# Patient Record
Sex: Female | Born: 1970 | Race: White | Hispanic: Yes | Marital: Married | Smoking: Never smoker
Health system: Southern US, Community
[De-identification: ages and names within clinical notes are randomized; demographics above are authoritative.]

## PROBLEM LIST (undated history)

## (undated) HISTORY — PX: CHOLECYSTECTOMY: SHX55

## (undated) HISTORY — PX: OTHER SURGICAL HISTORY: SHX169

---

## 2001-03-05 HISTORY — PX: COLON SURGERY: SHX602

## 2002-03-15 ENCOUNTER — Inpatient Hospital Stay (HOSPITAL_COMMUNITY): Admission: EM | Admit: 2002-03-15 | Discharge: 2002-03-17 | Payer: Self-pay | Admitting: Emergency Medicine

## 2002-03-15 ENCOUNTER — Encounter: Payer: Self-pay | Admitting: Emergency Medicine

## 2002-03-18 ENCOUNTER — Encounter: Admission: RE | Admit: 2002-03-18 | Discharge: 2002-03-18 | Payer: Self-pay | Admitting: Family Medicine

## 2002-03-23 ENCOUNTER — Encounter: Admission: RE | Admit: 2002-03-23 | Discharge: 2002-03-23 | Payer: Self-pay | Admitting: Family Medicine

## 2002-04-01 ENCOUNTER — Inpatient Hospital Stay (HOSPITAL_COMMUNITY): Admission: EM | Admit: 2002-04-01 | Discharge: 2002-04-06 | Payer: Self-pay | Admitting: Emergency Medicine

## 2002-04-01 ENCOUNTER — Encounter: Payer: Self-pay | Admitting: *Deleted

## 2002-04-15 ENCOUNTER — Encounter: Admission: RE | Admit: 2002-04-15 | Discharge: 2002-04-15 | Payer: Self-pay | Admitting: Family Medicine

## 2002-04-16 ENCOUNTER — Encounter: Admission: RE | Admit: 2002-04-16 | Discharge: 2002-04-16 | Payer: Self-pay | Admitting: Family Medicine

## 2002-05-07 ENCOUNTER — Encounter (INDEPENDENT_AMBULATORY_CARE_PROVIDER_SITE_OTHER): Payer: Self-pay | Admitting: *Deleted

## 2002-05-07 LAB — CONVERTED CEMR LAB

## 2002-05-14 ENCOUNTER — Encounter: Admission: RE | Admit: 2002-05-14 | Discharge: 2002-05-14 | Payer: Self-pay | Admitting: Family Medicine

## 2002-05-15 ENCOUNTER — Encounter: Admission: RE | Admit: 2002-05-15 | Discharge: 2002-05-15 | Payer: Self-pay | Admitting: Family Medicine

## 2002-05-28 ENCOUNTER — Encounter (INDEPENDENT_AMBULATORY_CARE_PROVIDER_SITE_OTHER): Payer: Self-pay | Admitting: *Deleted

## 2002-05-28 ENCOUNTER — Inpatient Hospital Stay (HOSPITAL_COMMUNITY): Admission: RE | Admit: 2002-05-28 | Discharge: 2002-06-06 | Payer: Self-pay | Admitting: General Surgery

## 2002-07-29 ENCOUNTER — Encounter: Admission: RE | Admit: 2002-07-29 | Discharge: 2002-07-29 | Payer: Self-pay | Admitting: Family Medicine

## 2002-12-10 ENCOUNTER — Encounter: Admission: RE | Admit: 2002-12-10 | Discharge: 2002-12-10 | Payer: Self-pay | Admitting: Family Medicine

## 2003-02-09 ENCOUNTER — Other Ambulatory Visit: Admission: RE | Admit: 2003-02-09 | Discharge: 2003-02-09 | Payer: Self-pay | Admitting: Obstetrics and Gynecology

## 2003-05-11 ENCOUNTER — Emergency Department (HOSPITAL_COMMUNITY): Admission: EM | Admit: 2003-05-11 | Discharge: 2003-05-11 | Payer: Self-pay | Admitting: Emergency Medicine

## 2003-06-10 ENCOUNTER — Encounter: Admission: RE | Admit: 2003-06-10 | Discharge: 2003-06-10 | Payer: Self-pay | Admitting: Family Medicine

## 2003-06-17 ENCOUNTER — Encounter: Admission: RE | Admit: 2003-06-17 | Discharge: 2003-06-17 | Payer: Self-pay | Admitting: *Deleted

## 2003-06-17 ENCOUNTER — Ambulatory Visit (HOSPITAL_COMMUNITY): Admission: RE | Admit: 2003-06-17 | Discharge: 2003-06-17 | Payer: Self-pay | Admitting: *Deleted

## 2003-06-23 ENCOUNTER — Encounter: Admission: RE | Admit: 2003-06-23 | Discharge: 2003-06-23 | Payer: Self-pay | Admitting: *Deleted

## 2003-06-24 ENCOUNTER — Encounter: Admission: RE | Admit: 2003-06-24 | Discharge: 2003-06-24 | Payer: Self-pay | Admitting: *Deleted

## 2003-07-01 ENCOUNTER — Encounter: Admission: RE | Admit: 2003-07-01 | Discharge: 2003-07-01 | Payer: Self-pay | Admitting: *Deleted

## 2003-07-08 ENCOUNTER — Encounter: Admission: RE | Admit: 2003-07-08 | Discharge: 2003-07-08 | Payer: Self-pay | Admitting: *Deleted

## 2003-07-13 ENCOUNTER — Inpatient Hospital Stay (HOSPITAL_COMMUNITY): Admission: AD | Admit: 2003-07-13 | Discharge: 2003-07-16 | Payer: Self-pay | Admitting: Obstetrics

## 2004-04-20 ENCOUNTER — Other Ambulatory Visit: Admission: RE | Admit: 2004-04-20 | Discharge: 2004-04-20 | Payer: Self-pay | Admitting: Obstetrics and Gynecology

## 2004-07-07 ENCOUNTER — Inpatient Hospital Stay (HOSPITAL_COMMUNITY): Admission: AD | Admit: 2004-07-07 | Discharge: 2004-07-10 | Payer: Self-pay | Admitting: *Deleted

## 2004-07-08 ENCOUNTER — Ambulatory Visit: Payer: Self-pay | Admitting: Obstetrics and Gynecology

## 2006-05-02 DIAGNOSIS — F329 Major depressive disorder, single episode, unspecified: Secondary | ICD-10-CM

## 2006-05-02 DIAGNOSIS — K5732 Diverticulitis of large intestine without perforation or abscess without bleeding: Secondary | ICD-10-CM

## 2006-05-03 ENCOUNTER — Encounter (INDEPENDENT_AMBULATORY_CARE_PROVIDER_SITE_OTHER): Payer: Self-pay | Admitting: *Deleted

## 2007-05-12 ENCOUNTER — Inpatient Hospital Stay (HOSPITAL_COMMUNITY): Admission: AD | Admit: 2007-05-12 | Discharge: 2007-05-14 | Payer: Self-pay | Admitting: Obstetrics & Gynecology

## 2008-12-24 ENCOUNTER — Emergency Department (HOSPITAL_COMMUNITY): Admission: EM | Admit: 2008-12-24 | Discharge: 2008-12-24 | Payer: Self-pay | Admitting: Emergency Medicine

## 2010-06-08 LAB — CBC
HCT: 38.8 % (ref 36.0–46.0)
MCV: 85.1 fL (ref 78.0–100.0)
Platelets: 202 10*3/uL (ref 150–400)
WBC: 7.4 10*3/uL (ref 4.0–10.5)

## 2010-06-08 LAB — COMPREHENSIVE METABOLIC PANEL
Albumin: 3.9 g/dL (ref 3.5–5.2)
BUN: 13 mg/dL (ref 6–23)
CO2: 25 mEq/L (ref 19–32)
Chloride: 106 mEq/L (ref 96–112)
Creatinine, Ser: 1 mg/dL (ref 0.4–1.2)
GFR calc non Af Amer: >60 mL/min (ref >60)
Total Bilirubin: 0.4 mg/dL (ref 0.3–1.2)

## 2010-06-08 LAB — DIFFERENTIAL
Basophils Absolute: 0 10*3/uL (ref 0.0–0.1)
Lymphocytes Relative: 32 % (ref 12–46)
Neutro Abs: 4.3 10*3/uL (ref 1.7–7.7)
Neutrophils Relative %: 58 % (ref 43–77)

## 2010-06-08 LAB — URINE CULTURE

## 2010-06-08 LAB — PROTIME-INR
INR: 0.92 (ref 0.00–1.49)
Prothrombin Time: 12.3 seconds (ref 11.6–15.2)

## 2010-06-08 LAB — URINALYSIS, ROUTINE W REFLEX MICROSCOPIC
Protein, ur: NEGATIVE mg/dL
Urobilinogen, UA: 0.2 mg/dL (ref 0.0–1.0)

## 2010-06-08 LAB — RAPID STREP SCREEN (MED CTR MEBANE ONLY): Streptococcus, Group A Screen (Direct): NEGATIVE

## 2010-07-21 NOTE — Discharge Summary (Signed)
NAME:  Anita Wheeler, Anita Wheeler                           ACCOUNT NO.:  [id number]   MEDICAL RECORD NO.:  [id number]                   PATIENT TYPE:  INP   LOCATION:  5735                                 FACILITY:  [facility]   PHYSICIAN:  Leighton Roach McDiarmid, M.D.             DATE OF BIRTH:  [birth year]   DATE OF ADMISSION:  03/14/2002  DATE OF DISCHARGE:  03/17/2002                                 DISCHARGE SUMMARY   DISCHARGE DIAGNOSES:  1. Cecal diverticulitis.  2. Bacterial vaginosis.   DISCHARGE MEDICATIONS:  1. Ciprofloxacin 500 mg 1 tablet q.8h. x 7 days.  2. Metronidazole 500 mg 1 tablet p.o. q.6h x 7 days.   DISPOSITION:  The patient was discharged to home in stable condition.   DIET:  The patient is to continue a very gentle, bland diet with nothing  spicy or greasy to eat, and ensure good fluid intake.   FOLLOW UP:  Dr. [name] at family practice clinic on 03/18/2002 at 2:15 p.m.  Plan to have barium enema study in two to three weeks after resolution of  symptoms to evaluate for diverticulosis.   CONSULTATIONS:  1. Dr. [name], general surgery.  2. Dr. Russella Dar, [facility].   HISTORY AND PHYSICAL:  The patient is a 40 year old female who presented  with one-day history of severe right-sided abdominal pain that was acute in  onset, sharp in nature, with progressive discomfort that was constant  without radiation.  No palliative measures were found to be effective.  No  fever.  Positive chills.  No nausea or vomiting.  Normal bowel movements.  No irregular bowel history in the past.  GYN Review of Systems was negative.  Last menstrual period 02/15/2002.   PHYSICAL EXAMINATION:  VITAL SIGNS:  Temperature 99.4, blood pressure 98/47.  ABDOMEN:  Nondistended.  There were no surgical or other scars.  Significantly decreased bowel sounds.  No significant tenderness to  palpation in the suprapubic region or right upper quadrant region with  severe tenderness in the right lower quadrant.  No  guarding.  Increased pain  with percussion but no clear rebound.  There were no masses palpable.   LABORATORY DATA:  White blood cell count 12, hemoglobin 13.6, platelets 230,  ANC 30.  Total bilirubin 0.5, alkaline phosphatase 99, AST 25, ALT 33.  HCG  was negative.  UA was negative.  Positive wet prep.   Abdominal CT showed inflammatory process in the cecum, distal terminal ilium  thickening, and mild thickening of the distal appendix.   HOSPITAL COURSE:  1. RIGHT-SIDED ABDOMINAL PAIN:  Dr. [name] from general surgery was     consulted to evaluate potentially acute abdomen.  After review of CT     scan, Dr. [name] felt acute appendicitis was unlikely and was more     concerned about diverticulitis versus infectious colitis versus irritable     bowel syndrome.  The patient was  started on ciprofloxacin 500 mg q.8h.     And metronidazole 500 mg q.6h.  GI was consulted, and Dr. Russella Dar evaluated     the patient and reviewed the abdominal CT and felt the imaging studies     were more consistent with cecal diverticulitis and approved antibiotic     regimen.  The patient remained afebrile during the hospitalization and     never developed leukocytosis.  LFTs remained within normal limits, and     amylase was 73, lipase 37 upon recheck.  The patient improved     dramatically with antibiotic regimen and at the time of discharge was     experiencing only minimal to moderate pain and was tolerated a low-     residue diet.  Of note, the patient did experience three loose stools in     the 24 hours prior to discharge.  Plans to have a followup barium enema     study to evaluate for diverticulosis will be made as an outpatient.   1. BACTERIAL VAGINOSIS.  Adequate treatment with metronidazole was     accomplished.   1. SOCIAL CONCERNS:  The patient did not have insurance at time of     admission.  She was evaluated by financial counseling and social work.     The patient does not qualify for  financial assistance nor does she     quality for Medicaid.  We will pursue further assistance programs as an     outpatient.   DISCHARGE LABORATORY DATA:  White count 5.7, hemoglobin 11.6, platelets 204.  ESR 15.  GC negative, Chlamydia negative, RPR nonreactive.     [name], M.D.                       Etta Grandchild, M.D.    SS/MEDQ  D:  03/17/2002  T:  03/17/2002  Job:  [id number]   cc:   Venita Lick. Pleas Koch., M.D. [facility]  [address]  [place]  [place] [address]  Fax: 1   Gabrielle Dare. [name], M.D.  [facility]  [address] [place], [place] [address]  Fax: [phone]

## 2010-07-21 NOTE — Discharge Summary (Signed)
NAME:  Anita Wheeler, Anita Wheeler                           ACCOUNT NO.:  [id number]   MEDICAL RECORD NO.:  [id number]                   PATIENT TYPE:  INP   LOCATION:  5502                                 FACILITY:  [facility]   PHYSICIAN:  Leighton Roach McDiarmid, M.D.             DATE OF BIRTH:  Sep 10, 1970   DATE OF ADMISSION:  04/01/2002  DATE OF DISCHARGE:  04/06/2002                                 DISCHARGE SUMMARY   DISCHARGE DIAGNOSES:  1. Diverticulitis.  2. Depression.  3. Gastritis.   CONSULTATIONS:  1. Gastroenterology.  2. [facility].   PROCEDURE:  Abdominal pelvic computed tomography scan.   DISCHARGE MEDICATIONS:  1. Flagyl 500 mg one pill three times per day x14 days.  2. Bactrim double strength one by mouth twice per day x14 days.  3. Prozac 20 mg p.o. q.d.  4. Ibuprofen 600 mg q.4-6h. as needed for pain.   FOLLOW UP:  The patient is to also see [name] at discharge or the  following day in order to sign up for medication assistance.  The patient  will follow up with Dr. [name] at [facility] on  Wednesday, February 15, at 9:15 a.m.  The patient is to call [phone], if  any problems.  The patient is to also follow up with Dr. [name] at  2:45 p.m. on February 11, Wednesday.   HISTORY OF PRESENT ILLNESS:  The patient is a 40 year old female who was  seen by her primary care physician for recent episodes of cecal  diverticulitis.  The patient was treated with IV antibiotics, bowel rest and  was discharged to home with followup approximately 1-1/2 weeks prior to  admission.  The patient began to have increasing pain in the right lower  quadrant and was scheduled to have barium enemas and outpatient workup, but  pain was severe two days prior to admission.  The patient denied any sweats  or any subjective chills at home.  She had no vomiting and complained of  some constipation.  She was admitted to the hospital for reevaluation of  cecal diverticulitis and worsening pain.   LABORATORY DATA AND X-RAY FINDINGS:  On admission, white count was 11.1.  UA  was negative.  HCG was negative.  The patient's electrolytes were stable.  The concern was for irritable bowel disease versus Crohn's.  The patient was  given morphine for pain management.   HOSPITAL COURSE:  Problem 1.  CECAL DIVERTICULITIS:  Abdominal CT was  performed that showed marked thickening along the medial cecum and between  the terminal ileum and appendix representing inflamed cecal diverticulum.  This was consistent with the possibility of Crohn's disease and acute  appendicitis was less likely differential.  Incidental noting was a 6 x 6.3  cm cyst to the left ovary with no free pelvic fluid.  This CT represented  markedly inflamed cecal diverticulum/cecal diverticulitis  over Crohn's  disease with inferior fistula formation and marked inflammation over  appendicitis.  The patient was consulted by Dr. Russella Dar from GI and Dr.  [name] from general surgery and believed that this was a focal  inflammatory process in cecum with diverticulitis.  The appendix appeared  okay.  GI recommended starting Flagyl 500 mg IV q.8h. and Cipro 400 mg IV  q.12h. with pain medications as needed.  Dr. [name] from general surgery  was also consulted for future surgical correction.  The patient's white  count decreased to 6.1 secondary to admission and the patient remained  afebrile.  The patient was continued on IV antibiotics and bowel rest and  was evaluated for possible elective right colectomy in four to six weeks by  Dr. [name].  If condition had worsened, the patient would have surgery  during the course of admission.  The patient continued to improve and was  able to start tolerating a p.o. diet.  The patient was then switched over to  p.o. antibiotics for a prolonged three week total course.  The plan was for  an elective right hemicolectomy in approximately four to  six weeks once  acute inflammation resolves.  The patient will follow up with Dr. [name]  in approximately two weeks and will also follow up with Dr. [name] in  approximately one week followup.  The patient was opted for Bactrim double  strength instead of Cipro secondary to the patient's inability to afford  expensive medication.  This was verified by [employer].   Problem 2.  DEPRESSION:  The patient was feeling very sad with the long  hospital stay and failure to reach resolution of acute abdominal pain.  She  said she has felt depressed in the past and feels very lonely here and has  had to quit her job several months ago and does not interact much outside  the house.  She has been concerned that she has felt sad for a long period  of time and it is felt that she could be depressed.  She feels that this  would enable her to go back to work once medically stable.  The patient also  claims to feel isolated secondary to her mother being in [place].  Her  husband has been trying to help with the situation and denies any help with  her symptoms.  The patient was originally started on Celexa 20 mg p.o. q.d.,  but was later switched over to Prozac 20 mg p.o. q.d. at the time of  discharge.  The patient will have follow up for depression and medicine  compliance at the time of followup with Dr. [name].   Problem 3.  HYPOKALEMIA:  The patient initially had a potassium of 3.1.  At  the time of admission, the patient was repleted with potassium and IV  fluids.  Potassium at the time of discharge was repleted to 3.6-3.7.  No  further therapy was needed at this time.   Problem 4.  GASTRITIS:  During the hospital course, the patient developed  some gastritis with epigastric pain likely gastritis versus reflux secondary  to several bouts of emesis.  She was started on PPI during hospital course  and will not continue as an outpatient.  She will follow up to see if therapy is needed as an  outpatient.  The patient unable to likely afford the  expensive cost of PPIs at this point and time.  Will evaluate with followup  with Dr.  Swick for possible therapy.    CONDITION ON DISCHARGE:  The patient was discharged on April 06, 2002, in  stable and improved condition and will follow up with dates as noted above.     Alvira Philips, M.D.                      Etta Grandchild, M.D.    RM/MEDQ  D:  04/06/2002  T:  04/07/2002  Job:  [id number]   cc:   [name], M.D.  [facility].  [address] Resident  [place]  [place] [address]  Fax: [phone]   Gabrielle Dare. [name], M.D.  [facility]  [address] [place], [place] [address]  Fax: [phone]

## 2010-07-21 NOTE — Discharge Summary (Signed)
   NAME:  Anita Wheeler, Anita Wheeler                           ACCOUNT NO.:  [id number]   MEDICAL RECORD NO.:  [id number]                   PATIENT TYPE:  INP   LOCATION:  5742                                 FACILITY:  [facility]   PHYSICIAN:  Gabrielle Dare. [name], M.D.             DATE OF BIRTH:  09-20-1970   DATE OF ADMISSION:  05/28/2002  DATE OF DISCHARGE:  06/06/2002                                 DISCHARGE SUMMARY   DISCHARGE DIAGNOSIS:  Status post right colectomy for recurrent cecal  diverticulitis.   HISTORY OF PRESENT ILLNESS:  The patient is a 40 year old female who  presented for elective right colectomy for recurrent cecal diverticulitis.   HOSPITAL COURSE:  The patient underwent an uncomplicated limited right  hemicolectomy.  During the operation, a 9 cm left ovarian cyst was  discovered and aspirated.  This fluid was sent for cytology which returned  benign ovarian cyst fluid.  The patient had some blood of a prolonged  postoperative ileus, but very gradually did have return of bowel function  and NG tube was then removed.  She was slowly advanced along on her diet.  She was going to be discharged home on April 2, but developed some nausea  and was kept overnight.  She had remained afebrile and hemodynamically  stable throughout her postoperative course.  The following day, she was  tolerating a regular diet.  Her abdomen remained soft and flat.  She had  full return of bowel function and was discharged home in stable condition.   DIET:  Regular.   ACTIVITY:  No lifting.   DISCHARGE MEDICATIONS:  Tylox 5/325 one every six hours p.r.n. pain.   FOLLOW UP:  Follow up with Dr. [name] in two to three weeks.                                                Gabrielle Dare [name], M.D.    BET/MEDQ  D:  07/07/2002  T:  07/09/2002  Job:  [id number]

## 2010-07-21 NOTE — Consult Note (Signed)
NAME:  Anita Wheeler, Anita Wheeler                           ACCOUNT NO.:  [id number]   MEDICAL RECORD NO.:  [id number]                   PATIENT TYPE:  INP   LOCATION:  1824                                 FACILITY:  [facility]   PHYSICIAN:  Gabrielle Dare. [name], M.D.             DATE OF BIRTH:  10/03/1970   DATE OF CONSULTATION:  DATE OF DISCHARGE:                                   CONSULTATION   REASON FOR CONSULTATION:  Right sided abdominal pain.   HISTORY OF PRESENT ILLNESS:  The patient is a 40 year old Hispanic female  who presented to the emergency room last night complaining of right-sided  abdominal pain which started at 6 a.m. yesterday, seemed to be acute in  onset, and along the right side.  She complained of some chills at home, but  did have a normal bowel movement yesterday.  She ate some food yesterday  afternoon around 4 p.m..  The pain persisted and she came to the ED.  She  denies any history of irregular bowel pattern or blood or mucus with her  stools and has otherwise been feeling well at this time.  She denies  anything that exacerbates her pain.  She has no other complaints.   PAST MEDICAL HISTORY:  None.   PAST SURGICAL HISTORY:  None.   MEDICATIONS:  None.  She has no known drug allergies.   SOCIAL HISTORY:  She does not use tobacco or alcohol and she denies street  drugs.   FAMILY HISTORY:  Her mother has diabetes mellitus.   REVIEW OF SYSTEMS/>  In general, negative.  PULMONARY:  Negative.  CARDIOVASCULAR:  Negative.  ABDOMEN:  Please see the history of present illness.  PSYCH:  She complains  of some depression.  MUSCULOSKELETAL:  Negative.  The rest of the review of  systems is negative.   PHYSICAL EXAMINATION:  VITAL SIGNS:  Blood pressure is 99/52.  Pulse 79.  Respirations 20.  Temperature 99.1.  She is 100% saturated on room air.  She  is awake, alert, and in no acute distress.  LUNGS:  Clear to auscultation bilaterally.  HEART:  Regular rate and rhythm.  NECK:  Supple without any palpable adenopathy.  HEENT:  Pupils are equal and reactive to light.  ABDOMEN:  Nondistended.  She has normal bowel sounds.  She does have some  moderate tenderness along the right side, more so in the right lower  quadrant.  She does not have any voluntary guarding. Her abdomen is soft.  She does not have any rebound tenderness.  No masses palpable.  EXTREMITIES:  Warm.   DATA REVIEWED:  Laboratories:  White blood cell count of 12,000.  Hemoglobin  13.6.  Hematocrit 41.4.  Platelets 230.  Sodium 132.  Potassium 3.1.  Chloride 103.  CO2 23. BUN 14.  Creatinine 0.8.  Glucose 99.  AST 25.  ALT  33. Alkaline phosphatase 99.  Bilirubin 0.5.  Urinalysis negative.  CAT scan  of the abdomen with some delayed cuts through the pelvis demonstrates  thickening and inflammation of her cecum as well as the terminal ileum.  The  appendix, though, appears normal.  It does have air in it and is not  significantly thickened, so it does not appear conducive of appendicitis.  The rest of her colon is normal in appearance.   IMPRESSION:  Abdominal pain with inflammatory changes of her cecum and  terminal ileum.  CAT scan does not appear consistent with appendicitis.  This is more of a picture of infectious colitis versus inflammatory bowel  disease.  I would recommend intermediate antibiotics; Cipro  and Flagyl,  bowel rest, and gastroenterology evaluation.  I discussed the above with Dr.  Benay Pillow and I will follow along with you to be sure no acute physical issues  develop.  Thank you very much for this consultation.                                                  Gabrielle Dare [name], M.D.    BET/MEDQ  D:  03/15/2002  T:  03/15/2002  Job:  [id number]

## 2010-07-21 NOTE — Consult Note (Signed)
NAME:  Anita Wheeler, Risk NO.:  [id number]   MEDICAL RECORD NO.:  [id number]                   PATIENT TYPE:   LOCATION:                                       FACILITY:   PHYSICIAN:  Gabrielle Dare. [name], M.D.             DATE OF BIRTH:  [birth year]   DATE OF CONSULTATION:  04/02/2002  DATE OF DISCHARGE:                                   CONSULTATION   CHIEF COMPLAINT:  Right lower quadrant pain.   HISTORY OF PRESENT ILLNESS:  The patient is a 40 year old female who was  seen by myself in the recent past for an episode of cecal diverticulitis.  At that time she was treated with intravenous antibiotics and bowel rest and  was discharged home and had been doing well at two follow ups 1 1/2 weeks  ago with her primary care family practice service.  The patient again  developed some increasing pain in right lower quadrant and she was scheduled  to have a barium enema as part of her work up but this pain increased for  two days prior to admission, she had no sweats although some subjective  chills at home, she had no vomiting and complained of some constipation.  She was admitted to the hospital yesterday.  Work up included a CAT scan  abdomen and pelvis which revealed some cecal inflammation along the medial  wall with some terminal ileum thickening and contrast passed in to the  appendix and the appendix was not thickened.  She was admitted back to the  hospital and started on IV antibiotics and kept NPO.  Today the patient  claims she is feeling significantly better.  She has less pain in the right  lower quadrant and she denies any nausea or vomiting.   PAST MEDICAL HISTORY:  Negative except for as mentioned in the history of  present illness.   PAST SURGICAL HISTORY:  None.   FAMILY HISTORY:  She claims her mother has diabetes.   SOCIAL HISTORY:  She does not drink alcohol, she does not use tobacco.   ALLERGIES:  She has no known drug allergies.   MEDICATIONS:  Currently in the hospital she is on:  1. IV ciprofloxacin.  2. IV Flagyl.  3. Phenergan.  4. P.R.N. pain medication.   REVIEW OF SYSTEMS:  In general claims to be somewhat better than on  admission.  PULMONARY:  Negative.  CARDIAC:  Negative.  GI:  Please see  history of present illness.  GU:  Negative.  Review of systems is otherwise  negative.   PHYSICAL EXAMINATION:  VITALS:  Her temperature is 98.0, blood pressure is  105/52, heart rate is 62, respirations are 18.  GENERAL:  She is awake, alert, in no acute distress.  HEENT:  Her pupils are equal and reactive.  NECK:  Supple, without significant adenopathy.  LUNGS:  Clear to auscultation bilaterally.  HEART:  Regular rate and rhythm.  ABDOMEN:  Soft and nondistended.  She does have right lower quadrant  tenderness to palpation but there is no voluntary guarding and no rebound  tenderness at this time.  The rest of her abdomen is nontender.  No  significant masses are palpable.  EXTREMITIES:  Warm with good distal pulses.   DATA REVIEWED:  Includes laboratory values demonstrate white blood cell  count 6.1, hemoglobin 11.8, hematocrit 35.4, platelets 189, sodium 136,  potassium 3.6, chloride 105, CO2 27, BUN 2, creatinine 0.7 and glucose 120.  AST 22, ALT 28, alkaline phosphatase 71 and a bilirubin 0.4, calcium 8.7.  Her CT scan of the abdomen and pelvis demonstrated inflammation of the  medial wall of the cecum consistent with cecal diverticulitis.   IMPRESSION:  Recurrent cecal diverticulitis, clinically improving since  admission.   PLAN:  It would be optimal to avoid surgical complications and any potential  for having to place an ileostomy, to continue medical therapy at this time  with intravenous antibiotics and bowel rest with plans for elective right  colectomy in four to six weeks.  We will proceed with operative intervention  on this admission if the patient greatly worsens clinically.  We will  follow  closely.  This plan was discussed at length with the patient in Spanish and  her questions were answered.  Also discussed with the primary team so at  this time I suggest continuing the intravenous Cipro and Flagyl with plans  to gradually advance her to a low residue diet over the next several days as  long as she continues to clinically improve and will plan to send her home  on a prolonged course of p.o. antibiotics.  Thank you very much for this  consult.                                               Gabrielle Dare [name], M.D.    BET/MEDQ  D:  04/02/2002  T:  04/02/2002  Job:  [id number]

## 2010-07-21 NOTE — H&P (Signed)
NAME:  Anita Wheeler, Anita Wheeler                           ACCOUNT NO.:  [id number]   MEDICAL RECORD NO.:  [id number]                   PATIENT TYPE:  INP   LOCATION:  1824                                 FACILITY:  [facility]   PHYSICIAN:  Sibyl Parr. Fields, M.D.                DATE OF BIRTH:  Apr 30, 1970   DATE OF ADMISSION:  03/14/2002  DATE OF DISCHARGE:                                HISTORY & PHYSICAL   CHIEF COMPLAINT:  Severe right sided abdominal pain for one day.   HISTORY OF PRESENT ILLNESS:  A 40 year old female without significant past  medical history who presents to [facility] ED complaining  of severe right sided abdominal pain, acute onset, right upper quadrant pain  at 0600 03/14/02, severe sharp pain in nature, progressive right upper  quadrant and right lower quadrant discomfort, constant, no radiation, no  palliative measures. Tried two Alka-Seltzer without relief. Increased pain  with movement, even breathing. No history of similar symptoms previously. No  temperature. Positive chills. No nausea and vomiting. Normal BM at 03/13/02 in  the afternoon. No blood noted. Last ate at 1600 of 1/10. Positive hamburger  sign now. Last menstrual period 02/15/02 which was normal. No use of birth  control. No known history of STDs or ovarian cysts or signs or symptoms of  pregnancy currently. No irregular bowel patterns in the past. No family  history of inflammatory bowel disease. Patient complaining of myalgias for  one day.   PAST MEDICAL HISTORY:  None.   MEDICATIONS:  None.   ALLERGIES:  No known drug allergies.   SOCIAL HISTORY:  Lives with husband. No children. Used to work as a Music therapist. No tobacco. No alcohol. No recreational drug use.   FAMILY HISTORY:  Diabetes. No inflammatory bowel disease.   REVIEW OF SYMPTOMS:  Sleeping well. Feels lonely and depressed. Tearful for  a few weeks now. No anhedonia. Positive headache every two weeks. No chest  pain. No  shortness of breath. No cough. No difficulty in breathing. No rash.  No arthralgias. No PCP. 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   PHYSICAL EXAMINATION:  GENERAL:  The patient is a well-developed, Hispanic  female lying still in discomfort.  VITAL SIGNS:  Temperature 99.4, heart rate 69, blood pressure 98.47,  respiratory rate 24.  HEENT:  Pupils are equal, round, and reactive to light. Mucosa membranes  pink and tacky. Lips are dry and cracked.  CARDIOVASCULAR EXAM:  Normal precordium. S1 and S2. Regular, rate, and  rhythm. No ectopy. 2/2 radial and dorsal pedal pulses bilaterally.  LUNGS:  Good air exchange. No accessory muscle use. Clear to auscultation  bilaterally. No crackles, wheeze, or rhonchi.  ABDOMEN:  Nondistended. No scars. Significantly decreased bowel sounds.  Significant tenderness to palpation suprapubic region and right upper  quadrant, severe tenderness to palpation in the right lower quadrant. No  guarding. Increased pain  with percussion but no clear rebound. No masses.  BACK:  Positive right lower quadrant pain on palpation.  EXTREMITIES:  No erythema, edema, cellulitis, or pallor.  SKIN:  Positive rash with a 2-cm erythematous patch above the left clavicle.  Nonblanching. Well demarcated. No excoriation.   LABORATORY DATA:  White blood cell count 12, hemoglobin 13.6, hematocrit  41.4, platelets 230. ANC 230. Sodium 132, potassium 3.1, bicarb 103,  chloride 23, BUN 14, creatinine 0.8, glucose 99, calcium 9.5. Total  bilirubin 0.5, alkaline phosphatase 99, AST 25, ALT 33, total protein 7.1,  albumin 3.7. HCG negative. UA negative. Wet prep:  No yeasts, no trich, many  clue cells. GC, Chlamydia, and RPR pending. Abdominal CT shows inflammatory  process in the cecum with cecal ties, distal terminal ileum thickened, mild  thickening of the distal appendix, no abscess, no perforation, no  obstruction.   ASSESSMENT/PLAN:  A 40 year old female with bacterial vaginosis and  severe  right abdominal pain for one day prior to admission, concerning for acute  appendicitis versus diverticulitis versus infectious colitis.   1. Right abdominal pain. Consulted general surgery, Dr. [name], to     evaluate patient and will provide analgesia. CT suggests IBD but no     family history and no history of irregular bowel patterns with normal BM     36 hours prior to admission. Possibly diverticulitis but no temperature     and no white blood cell increase; however, CT shows inflammation. No     history of colitis in the past. This may be infectious colitis, however.     No known history of STDs, so PID is less likely. We will follow GC and     Chlamydia probes. HCG is negative; no signs or symptoms of pregnancy;     therefore ectopic is less likely. Negative pregnancy on CT. No ovary     torsion noted on CT. No ovarian cyst. With severe pain, decreased bowel     sounds, positive hamburger sign, concerned about acute appendicitis. Will     await surgery evaluation. Thank you for their assistance.  2. Hypotension. Repeat normal saline 500 cc IV bolus.  3. Hyperkalemia. Will add potassium chloride to IV fluids.  4. Bacterial vaginosis. Will provide metronidazole 500 mg IV q.8h. x4.     [name], M.D.                       Sibyl Parr. Darrick Penna, M.D.    SS/MEDQ  D:  03/15/2002  T:  03/15/2002  Job:  [id number]

## 2010-07-21 NOTE — Op Note (Signed)
NAME:  Anita Wheeler, Anita Wheeler                           ACCOUNT NO.:  [id number]   MEDICAL RECORD NO.:  [id number]                   PATIENT TYPE:  INP   LOCATION:  5742                                 FACILITY:  [facility]   PHYSICIAN:  Gabrielle Dare. [name], M.D.             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   POSTOPERATIVE DIAGNOSES:  1. 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  2. A 9 cm left ovarian cyst.   PROCEDURES:  1. Right colectomy.  2. Aspiration of left ovarian cyst.   SURGEON:  Gabrielle Dare. [name], M.D.   ASSISTANT:  Abigail Miyamoto, M.D.   ANESTHESIA:  General.   SPECIMENS:  Left ovarian cyst fluid was sent to cytology.   CLINICAL NOTE:  The patient is a 40 year old Hispanic female who had two  episodes of cecal diverticulitis several months ago and due to the recurrent  problems she is having with this, she presents for an elective right  colectomy.   DESCRIPTION OF PROCEDURE:  The patient was brought to the operating room,  and she received intravenous antibiotics.  Her abdomen was prepped and  draped in a sterile fashion.  A periumbilical incision was made through the  skin, subcutaneous tissues were dissected down, and the fascia was divided  along the linea alba and the abdomen was entered under direct vision.  On  exploration, she was found to have two large diverticula on the medial  portion of her cecum, which appeared mildly inflamed.  Other exploration  revealed a large 9 cm ovarian cyst.  Addressing that was delayed toward the  end of the case.  Attention was redirected to the cecum.  The cecum was  freed from its retroperitoneal attachments inferiorly.  Using the Bovie  cautery, this retroperitoneal dissection was carried up to the terminal  ileum and then continued along the right colon  laterally along the white  line of Toldt approaching the hepatic flexure.  After closely inspecting the  remainder of the right colon and finding no other diverticular disease, a  decision was made to keep this a limited resection of the proximal right  colon and cecum, as this is where her problem was centered.  The  retroperitoneal attachments were freed up to this area of the colon and  exposing the mesentery.  At this time a portion to divide the terminal ileum  was selected several centimeters from the ileocecal valve.  The ileum at  that point was divided with a GIA-75 stapler.  Subsequently the mesentery of  the ileum and cecum was sequentially taken down using clamps, and it was  divided and securely ligated with 2-0 silk sutures.  The remainder of the  mesentery of the freed-up right colon was sequentially  taken down and  divided and securely tied with 2-0 silk sutures with good hemostasis.  Once  this was accomplished, the colon was divided with the GIA-75 stapler.  One  small remaining portion of the mesentery was sequentially clamped, divided,  and securely ligated, and the specimen was sent to pathology.  Hemostasis  was ensured in the mesenteric bed and subsequently a side-to-side  anastomosis was made with the ileum to the colon in the following fashion:  First the ileum was laid alongside of the taenia of the colon and two 3-0  silk stay sutures were placed and held with hemostats.  Once this was  accomplished, an enterotomy was made in the distal ileum and then the  proximal colon, facilitating insertion of a GIA-75 stapler.  The stapler was  then fired.  The staple lines were noted to be hemostatic, and the resulting  enterotomy was closed with a TA-60 stapler without difficulty.  Three figure-  of-eight 3-0 silk sutures were placed along the staple line to get excellent  hemostasis.  The anastomosis was patent and palpable and some enteric  contents was milked across its  length, and excellent patency was noted.  The  area was hemostatic.  Subsequently the rent in the mesentery was closed with  a series of figure-of-eight 2-0 silks in an interrupted fashion.  The  retroperitoneum area then all was irrigated, rechecked, and noted to be  hemostatic at this time.  The attention was directed to the left ovarian  cyst.  This was aspirated and the fluid was sent to cytology.  A small  opening was created at the site of the aspiration to facilitate complete  drainage, and the ovary was returned down to the pelvis.  No other  abnormalities except for some uterine fibroids were noted.  The right ovary  was normal.  Exploration of the remainder of the abdomen revealed no other  abnormalities.  The NG tube position was checked and the abdomen was  copiously irrigated with 2 L of saline.  The anastomosis was rechecked.  There was good hemostasis and good patency, and the abdomen was subsequently  closed with running #1 PDS, closing the fascia, and the subcutaneous tissues  were copiously irrigated and the skin was closed with staples.  Sponge,  needle, and instrument counts were all correct.  The patient tolerated the  procedure well without complication and was taken to the recovery room in  stable condition.  Note that the ovarian fluid was sent for cytology.                                               Gabrielle Dare [name], M.D.    BET/MEDQ  D:  05/28/2002  T:  05/29/2002  Job:  [id number]

## 2010-11-27 LAB — CBC
HCT: 26.2 — ABNORMAL LOW
MCV: 85.7
RBC: 3.06 — ABNORMAL LOW
WBC: 10.6 — ABNORMAL HIGH

## 2010-11-27 LAB — RPR: RPR Ser Ql: NONREACTIVE

## 2011-10-30 ENCOUNTER — Ambulatory Visit: Payer: Self-pay | Admitting: Gynecology

## 2011-11-01 ENCOUNTER — Other Ambulatory Visit (HOSPITAL_COMMUNITY)
Admission: RE | Admit: 2011-11-01 | Discharge: 2011-11-01 | Disposition: A | Discharge status: Home / Self Care | Payer: Self-pay | Source: Ambulatory Visit | Attending: Gynecology | Admitting: Gynecology

## 2011-11-01 ENCOUNTER — Ambulatory Visit (INDEPENDENT_AMBULATORY_CARE_PROVIDER_SITE_OTHER): Payer: Self-pay | Admitting: Gynecology

## 2011-11-01 ENCOUNTER — Encounter: Payer: Self-pay | Admitting: Gynecology

## 2011-11-01 VITALS — BP 118/72 | Ht 61.25 in | Wt 173.0 lb

## 2011-11-01 DIAGNOSIS — L293 Anogenital pruritus, unspecified: Secondary | ICD-10-CM

## 2011-11-01 DIAGNOSIS — R635 Abnormal weight gain: Secondary | ICD-10-CM

## 2011-11-01 DIAGNOSIS — Z01419 Encounter for gynecological examination (general) (routine) without abnormal findings: Secondary | ICD-10-CM | POA: Insufficient documentation

## 2011-11-01 DIAGNOSIS — N898 Other specified noninflammatory disorders of vagina: Secondary | ICD-10-CM

## 2011-11-01 DIAGNOSIS — Z1151 Encounter for screening for human papillomavirus (HPV): Secondary | ICD-10-CM | POA: Insufficient documentation

## 2011-11-01 LAB — CBC WITH DIFFERENTIAL/PLATELET
Basophils Relative: 1 % (ref 0–1)
HCT: 39.9 % (ref 36.0–46.0)
Hemoglobin: 13.5 g/dL (ref 12.0–15.0)
Lymphs Abs: 2.2 10*3/uL (ref 0.7–4.0)
MCHC: 33.8 g/dL (ref 30.0–36.0)
Monocytes Absolute: 0.5 10*3/uL (ref 0.1–1.0)
Monocytes Relative: 7 % (ref 3–12)
Neutro Abs: 3.3 10*3/uL (ref 1.7–7.7)
RBC: 4.91 MIL/uL (ref 3.87–5.11)

## 2011-11-01 LAB — HEMOGLOBIN A1C
Hgb A1c MFr Bld: 6 % — ABNORMAL HIGH (ref <5.7)
Mean Plasma Glucose: 126 mg/dL — ABNORMAL HIGH (ref <117)

## 2011-11-01 LAB — CHOLESTEROL, TOTAL: Cholesterol: 188 mg/dL (ref 0–200)

## 2011-11-01 NOTE — Progress Notes (Signed)
Anita Wheeler 1970/11/30 [id number]   History:    41 y.o.  for annual gyn exam who was a new patient to the practice. Patient stated that she has not had a gynecological examination in over 4 years. She was getting her Pap smear at [facility] hospital. She states in the past her Pap smears been normal. She has 3 children which were delivered vaginally. Her husband is using condoms as a form of contraception. Sometimes she experiences vulvar pruritus before and after her menses. She is otherwise asymptomatic today. She in frequently does her self breast examination. No prior mammograms. Negative family history for any form of cancer. Her mother has type 2 diabetes. Patient had colon resection in 2004 and appendectomy as a result of diverticulosis. Also patient had a prior cholecystectomy. She had her tetanus shot approximately 9 years ago.  Past medical history,surgical history, family history and social history were all reviewed and documented in the EPIC chart.  Gynecologic History Patient's last menstrual period was 10/08/2011. Contraception: condoms Last Pap: 4 years ago. Results were: normal Last mammogram: No prior study. Results were: No prior study  Obstetric History OB History    Grav Para Term Preterm Abortions TAB SAB Ect Mult Living   3 3 3       3      # Outc Date GA Lbr Len/2nd Wgt Sex Del Anes PTL Lv   1 TRM     F SVD  No Yes   2 TRM     M SVD  No Yes   3 TRM     M SVD  No Yes       ROS: A ROS was performed and pertinent positives and negatives are included in the history.  GENERAL: No fevers or chills. HEENT: No change in vision, no earache, sore throat or sinus congestion. NECK: No pain or stiffness. CARDIOVASCULAR: No chest pain or pressure. No palpitations. PULMONARY: No shortness of breath, cough or wheeze. GASTROINTESTINAL: No abdominal pain, nausea, vomiting or diarrhea, melena or bright red blood per rectum. GENITOURINARY: No urinary frequency, urgency, hesitancy or  dysuria. MUSCULOSKELETAL: No joint or muscle pain, no back pain, no recent trauma. DERMATOLOGIC: No rash, no itching, no lesions. ENDOCRINE: No polyuria, polydipsia, no heat or cold intolerance. No recent change in weight. HEMATOLOGICAL: No anemia or easy bruising or bleeding. NEUROLOGIC: No headache, seizures, numbness, tingling or weakness. PSYCHIATRIC: No depression, no loss of interest in normal activity or change in sleep pattern.     Exam: chaperone present  BP 118/72  Ht 5' 1.25" (1.556 m)  Wt 173 lb (78.472 kg)  BMI 32.42 kg/m2  LMP 10/08/2011  Body mass index is 32.42 kg/(m^2).  General appearance : Well developed well nourished female. No acute distress HEENT: Neck supple, trachea midline, no carotid bruits, no thyroidmegaly Lungs: Clear to auscultation, no rhonchi or wheezes, or rib retractions  Heart: Regular rate and rhythm, no murmurs or gallops Breast:Examined in sitting and supine position were symmetrical in appearance, no palpable masses or tenderness,  no skin retraction, no nipple inversion, no nipple discharge, no skin discoloration, no axillary or supraclavicular lymphadenopathy Abdomen: no palpable masses or tenderness, no rebound or guarding Extremities: no edema or skin discoloration or tenderness  Pelvic:  Bartholin, Urethra, Skene Glands: Within normal limits             Vagina: No gross lesions or discharge  Cervix: No gross lesions or discharge  Uterus  anteverted, normal size, shape and consistency,  non-tender and mobile  Adnexa  Without masses or tenderness  Anus and perineum  normal   Rectovaginal  normal sphincter tone without palpated masses or tenderness             Hemoccult not done     Assessment/Plan:  41 y.o. female for annual exam for which no prior Pap smears in the past 4 years. Pap smear was done today. Due to patient's family history of diabetes and her weight gain a hemoglobin A1c will be drawn today along with a screening cholesterol,  CBC, urinalysis and TSH. Next year will give her the Tdap vaccine. Literature formation on self breast examination as well as on cholesterol-lowering diet and exercise was provided. All the above was discussed with the patient in Spanish and we'll follow accordingly.   Ok Edwards MD, 9:36 AM 11/01/2011

## 2011-11-01 NOTE — Patient Instructions (Addendum)
Control del colesterol  Los niveles de colesterol en el organismo estn determinados significativamente por su dieta. Los niveles de colesterol tambin se relacionan con la enfermedad cardaca. El material que sigue ayuda a Software engineer relacin y a Chiropractor qu puede hacer para mantener su corazn sano. No todo el colesterol es [place]. Las lipoprotenas de baja densidad (LDL) forman el colesterol "malo". El colesterol malo puede ocasionar depsitos de grasa que se acumulan en el interior de las arterias. Las lipoprotenas de alta densidad (HDL) es el colesterol "bueno". Ayuda a remover el colesterol LDL "malo" de la [place]. El colesterol es un factor de riesgo muy importante para la enfermedad cardaca. Otros factores de riesgo son la hipertensin arterial, el hbito de fumar, el estrs, la herencia y [place].  El msculo cardaco obtiene el suministro de sangre a travs de las arterias coronarias. Si su colesterol LDL ("malo") est elevado y el HDL ("bueno") es bajo, tiene un factor de riesgo para que se formen depsitos de Holiday representative en las arterias coronarias (los vasos sanguneos que suministran sangre al corazn). Esto hace que haya menos lugar para que la sangre circule. Sin la suficiente sangre y oxgeno, el msculo cardaco no puede funcionar correctamente, y usted podr sentir dolores en el pecho (angina pectoris). Cuando una arteria coronaria se cierra completamente, una parte del msculo cardaco puede morir (infarto de miocardio). CONTROL DEL COLESTEROL Cuando el profesional que lo asiste enva la sangre al laboratorio para Artist nivel de colesterol, puede realizarle tambin un perfil completo de los lpidos. Con esta prueba, se puede determinar la cantidad total de colesterol, as como los niveles de LDL y HDL. Los triglicridos son un tipo de grasa que circula en la sangre y que tambin puede utilizarse para determinar el riesgo de enfermedad  cardaca. En la siguiente tabla se establecen los nmeros ideales: Prueba: Colesterol total  Menos de 200 mg/dl.  Prueba: LDL "colesterol malo"  Menos de 100 mg/dl.   Menos de 70 mg/dl si tiene riesgo muy elevado de sufrir un ataque cardaco o muerte cardaca sbita.  Prueba: HDL "colesterol bueno"  Mujeres: Ms de 50 mg/dl.   Hombres: Ms de 40 mg/dl.  Prueba: Trigliceridos  Menos de 150 mg/dl.  CONTROL DEL COLESTEROL CON DIETA Aunque factores como el ejercicio y el estilo de vida son importantes, la "primera lnea de ataque" es la dieta. Esto se debe a que se sabe que ciertos alimentos hacen subir el colesterol y otros lo [place]. El objetivo debe ser [employer] Foods alimentos, de modo que tengan un efecto sobre el colesterol y, an ms importante, Microbiologist las grasas saturadas y trans con otros tipos de grasas, como las monoinsaturadas y las poliinsaturadas y cidos grasos omega-3 . En promedio, una persona no debe consumir ms de 15 a 17 g de grasas saturadas por C.H. Robinson Worldwide. Las grasas saturadas y trans se consideran grasas "malas", ya que elevan el colesterol LDL. Las grasas saturadas se encuentran principalmente en productos animales como carne, Miles y crema. Pero esto no significa que usted Marketing executive todas sus comidas favoritas. Actualmente, como lo muestra el cuadro que figura al final de este documento, hay sustitutos de buen sabor, bajos en grasas y en colesterol, para la mayora de los alimentos que a usted Musician. Elija aquellos alimentos alternativos que sean bajos en grasas o sin grasas. Elija cortes de carne del cuarto trasero o lomo ya que estos cortes son los que tienen menor cantidad de grasa y Oncologist. El pollo (  sin piel), el pescado, la carne de ternera, y la [place] de [place] molida son excelentes opciones. Elimine las carnes Tyson Foods o el salami. Los Federal[employer]Mogul o nada de grasas saturadas. Cuando consuma carne [place], carne de aves de  corral, o pescado, hgalo en porciones de 85 gramos (3 onzas). Las grasas trans tambin se llaman "aceites parcialmente hidrogenados". Son aceites manipulados cientficamente de [place] que son slidos a Publishing rights manager, tienen una larga vida y Glass blower/designer sabor y la textura de los alimentos a los que se Scientist, clinical (histocompatibility and immunogenetics). Las grasas trans se encuentran en la [place], [place], crackers y alimentos horneados.  Para hornear y cocinar, el aceite es un excelente sustituto para la [place]. Los aceites monoinsaturados tienen un beneficio particular, ya que se cree que disminuyen el colesterol LDL (colesterol malo) y elevan el HDL. Deber evitar los aceites tropicales saturados como el de coco y el de [place].  Recuerde, adems, que puede comer sin restricciones los grupos de alimentos que son naturalmente libres de grasas saturadas y Neurosurgeon trans, entre los que se incluyen el pescado, las frutas (excepto el [place]), verduras, frijoles, cereales (cebada, arroz, [place], trigo) y las pastas (sin salsas con crema)  IDENTIFIQUE LOS ALIMENTOS QUE DISMINUYEN EL COLESTEROL  Pueden disminuir el colesterol las fibras solubles que estn en las frutas, como las [place], en los vegetales como el brcoli, las patatas y las zanahorias; en las legumbres como frijoles, guisantes y Therapist, occupational; y en los cereales como la cebada. Los alimentos fortificados con fitosteroles tambin Engineer, production. Debe consumir al menos 2 g de estos alimentos a diario para Financial planner de disminucin de [place].  En el supermercado, lea las etiquetas de los envases para identificar los alimentos bajos en grasas saturadas, libres de grasas trans y bajos en [place], . Elija quesos que tengan solo de 2 a 3 g de grasa saturada por onza (28,35 g). Use una margarina que no dae el corazn, [place] de grasas trans o aceite parcialmente hidrogenado. Al comprar alimentos horneados (galletitas dulces y Gaffer) evite el aceite parcialmente  hidrogenado. Los panes y bollos debern ser de granos enteros (harina de maz o de avena entera, en lugar de "harina" o "harina enriquecida"). Compre sopas en lata que no sean cremosas, con bajo contenido de sal y sin grasas adicionadas.  TCNICAS DE PREPARACIN DE LOS ALIMENTOS  Nunca fra los alimentos en aceite abundante. Si debe frer, hgalo en poco aceite y removiendo [place], porque as se utilizan muy pocas grasas, o utilice un spray antiadherente. Cuando le sea posible, hierva, hornee o ase las carnes y cocine los vegetales al vapor. En vez de [employer] con mantequilla o [place], utilice limn y hierbas, pur de Psychologist, educational y canela (para las calabazas y batatas), yogurt y salsa descremados y aderezos para ensaladas bajos en contenido graso.  BAJO EN GRASAS SATURADAS / SUSTITUTOS BAJOS EN GRASA  Carnes / Grasas saturadas (g)  Evite: Bife, corte graso (3 oz/85 g) / 11 g   Elija: Bife, corte magro (3 oz/85 g) / 4 g   Evite: Hamburguesa (3 oz/85 g) / 7 g   Elija:  [place] magra (3 oz/85 g) / 5 g   Evite: Jamn (3 oz/85 g) / 6 g   Elija:  Jamn magro (3 oz/85 g) / 2.4 g   Evite: Pollo, con piel (3 oz/85 g), Carne oscura / 4 g   Elija:  Pollo, sin piel (3 oz/85 g), Carne oscura / 2  g   Evite: Pollo, con piel (3 oz/85 g), Carne magra / 2.5 g   Elija: Pollo, sin piel (3 oz/85 g), Carne magra / 1 g  Lcteos / Grasas saturadas (g)  Evite: Leche entera (1 taza) / 5 g   Elija: Leche con bajo contenido de grasa, 2% (1 taza) / 3 g   Elija: Leche con bajo contenido de grasa, 1% (1 taza) / 1.5 g   Elija: Leche descremada (1 taza) / 0.3 g   Evite: Queso duro (1 oz/28 g) / 6 g   Elija: Queso descremado (1 oz/28 g) / 2-3 g   Evite: Queso cottage, 4% grasa (1 taza)/ 6.5 g   Elija: Queso cottage con bajo contenido de grasa, 1% grasa (1 taza)/ 1.5 g   Evite: Helado (1 taza) / 9 g   Elija: Sorbete (1 taza) / 2.5 g   Elija: Yogurt helado sin contenido de grasa  (1 taza) / 0.3 g   Elija: Barras de fruta congeladas / vestigios   Evite: Crema batida (1 cucharada) / 3.5 g   Elija: Batidos glac sin lcteos (1 cucharada) / 1 g  Condimentos / Grasas saturadas (g)  Evite: Mayonesa (1 cucharada) / 2 g   Elija: Mayonesa con bajo contenido de grasa (1 cucharada) / 1 g   Evite: [place]eca (1 cucharada) / 7 g   Elija: Margarina extra light (1 cucharada) / 1 g   Evite: Aceite de coco (1 cucharada) / 11.8 g   Elija: Aceite de oliva (1 cucharada) / 1.8 g   Elija: Aceite de maz (1 cucharada) / 1.7 g   Elija: Aceite de crtamo (1 cucharada) / 1.2 g   Elija: Aceite de girasol (1 cucharada) / 1.4 g   Elija: Aceite de soja (1 cucharada) / 2.4 g   Elija: Aceite de canola (1 cucharada) / 1 g  Document Released: 02/19/2005 Document Revised: 11/01/2010 [facility] Patient Information 2012 [place], [place].  Ejercicios para perder peso (Exercise to Lose Weight)  La actividad fsica y [place]omia Dear dieta saludable ayudan a perder peso. El mdico podr sugerirle ejercicios especficos. IDEAS Y CONSEJOS PARA HACER EJERCICIOS  Elija opciones econmicas que disfrute hacer , como caminar, andar en bicicleta o los vdeos para ejercitarse.   Utilice las Microbiologist del ascensor.   Camine durante la hora del almuerzo.   Estacione el auto lejos del lugar de Pabellones o Bernie.   Concurra a un gimnasio o tome clases de gimnasia.   Comience con 5  10 minutos de actividad fsica por da. Ejercite hasta 30 minutos, 4 a 6 das por [address]   Utilice zapatos que tengan un buen soporte y ropas cmodas.   Elongue antes y despus de Company secretary.   Ejercite hasta que aumente la respiracin y el corazn palpite rpido.   Beba agua extra cuando ejercite.   No haga ejercicio Firefighter, sentirse mareado o que le falte mucho el aire.  La actividad fsica puede quemar alrededor de 150 caloras.  Correr 20 cuadras en 15 minutos.   Jugar vley durante 45 a 60 minutos.    Limpiar y encerar el auto durante 45 a 60 minutos.   Jugar ftbol americano de toque.   Caminar 25 cuadras en 35 minutos.   Empujar un cochecito 20 cuadras en 30 minutos.   Jugar baloncesto durante 30 minutos.   Rastrillar hojas secas durante 30 minutos.   Andar en bicicleta 80 cuadras en 30 minutos.   Caminar 30 cuadras en  30 minutos.   Bailar durante 30 minutos.   Quitar la nieve con una pala durante 15 minutos.   Nadar vigorosamente durante 20 minutos.   Subir escaleras durante 15 minutos.   Andar en bicicleta 60 cuadras durante 15 minutos.   Arreglar el jardn entre 30 y 45 minutos.   Saltar a la soga durante 15 minutos.   Limpiar vidrios o pisos durante 45 a 60 minutos.  Document Released: 05/26/2010 Document Revised: 11/01/2010 [facility] Patient Information 2012 [place], [place].   Vacuna difteria/ttanos (Td) o [place] difteria, ttanos, tos convulsa (Tdap), Lo que debe saber (Tetanus, Diphtheria [Td] or Tetanus, Diphtheria, Pertussis [Tdap] Vaccine, What You Need to Know) PORQU VACUNARSE? El ttanos , la difteria y la tos ferina pueden ser enfermedades graves.  El TTANOS  (trismo) provoca la contraccin dolorosa y rigidez de los msculos, por lo general, en todo el cuerpo.   Puede causar la contraccin de los msculos de la cabeza y el cuello de modo que el enfermo no puede abrir la boca ni tragar., y en algunos casos, tampoco puede respirar.. El ttanos causa la muerte de 1 de cada 5 personas que se infectan.  LA DIFTERIA produce la formacin de una membrana gruesa que cubre el fondo de la garganta.  Puede causar problemas respiratorios, parlisis, insuficiencia cardaca, e incluso la muerte.  El PERTUSIS (tos [place]) causa ataques de tos intensa que pueden dificultar la respiracin, provocar vmitos e interrumpir el sueo.   Puede causar prdida de peso, incontinencia, fractura de Valeria, y desmayos por la intensa tos. Hasta de 2 de cada 100 adolescentes  y 5 de cada 100 adultos que enferman de tos [place] deben ser hospitalizados o tienen complicaciones como la neumona y la [place].  Estas 3 enfermedades son provocadas por bacterias. La difteria y la tos Benetta Spar se [place] de persona a Social worker. El ttanos ingresa al organismo a travs de cortes, rasguos o heridas. En los Estados [place] ocurran alrededor de 200 000 casos por ao de difteria y tos [place], antes de que existieran las [place], y tambin ocurran cientos de casos de ttanos. Desde la aparicin de las vacunas, el ttanos y la difteria han disminuido en alrededor del 99% y los casos de tos ferina disminuyeron aproximadamente el 92%.  Los nios menores de 6 aos deben recibir la vacuna DTaP para estar protegidos contra estas tres enfermedades. Pero los [employer], los adolescentes y los adultos tambin necesitan proteccin. VACUNAS PARA ADOLESCENTES Y ADULTOS Vacunas Tdap y Td  Hay dos vacunas disponibles para proteger de estas enfermedades a nios a Glass blower/designer de los 7aos:   La vacuna Td fue utilizada durante muchos aos. Protege contra el ttanos y la difteria.   La vacuna Tdap fue autorizada en 2005. Es la primera vacuna para adolescentes y adultos que protege contra la tos ferina y el ttanos y la difteria.  Una dosis de refuerzo de la Td se recomienda cada 10 aos. La Tdap se aplica slo una vez.  QU VACUNA DEBO APLICARME Y CUANDO? Las edades de 7 a 18 aos  Dynegy 11 y los 12 aos se recomienda una dosis de Tdap. Esta dosis puede aplicarse desde los 7 aos en los nios que no han recibido una o ms dosis de DTaP anteriormente.   Los nios y adolescentes que no recibieron todas las dosis programadas de DTaP o DTP a los 7 aos deben completar la serie usando una combinacin de Td y Tdap.  Adultos de 19 aos o ms  Todos los adultos deben recibir una dosis de refuerzo de Td cada 10 aos. Los adultos de menos de 65 aos que nunca hayan recibido la Tdap deben reemplazarla por la  siguiente dosis de refuerzo. Los adultos a partir de los 65 aos puedenrecibir una dosis de Tdap.   Los adultos (incluyendo las mujeres que podran quedar embarazadas y los adultos mayores de 65 aos) que tienen contacto cercano con un beb menor de 12 meses deben aplicarse una dosis de Tdap para proteger al beb de la tos [place].   Los trabajadores de la salud que tengan contacto directo con pacientes en hospitales o clnicas deben recibir una dosis de Tdap.  Proteccin despus de [place] herida  Es posible que una persona que tenga un corte o quemadura grave necesite una dosis de Td o Tdap para prevenir la infeccin por ttanos. Puede usarse la Tdap en personas que nunca recibieron una dosis. Pero debe usarse la Td, si la Tdap no se encuentra disponible, o para:   Cualquier persona que haya recibido una dosis de Tdap.   Los nios [employer] 7 y los 9 aos que han C.H. Robinson Worldwide series de DTap anteriormente.   Adultos de 65 aos o ms.  Mujeres embarazadas.   Las mujeres embarazadas que nunca recibieron una dosis de Ddap deben recibirla despus de la 20a semana de gestacin y preferiblemente durante Contractor. trimestre. Si no se aplican la Tdap durante el embarazo, deben recibirla lo antes posible despus del parto. Las mujeres embarazadas que han recibido la Tdap y tienen que aplicarse la vacuna contra el ttanos o la difteria durante el [place], deben recibir la Td.  Las vacunas Tdap y Td pueden ser administradas al mismo tiempo que otras vacunas. ALGUNAS PERSONAS NO DEBEN RECIBIR LA VACUNA O DEBEN [employer]lett[employer]ard  Las personas que hayan tenido una reaccin alrgica que haya puesto en peligro su vida despus de una dosis de vacuna contra el ttanos, la difteria o la tos ferina no deben recibir Td ni Tdap..   Las personas que tengan alergias graves a algn componente de una vacuna no deben recibir esa vacuna. Informe a su mdico si la persona que recibe la vacuna sufre alergias graves.   Cualquier  persona que American Standard Companies en coma o que haya tenido convulsiones dentro de los 7 [address] posteriores despus de una dosis de DTP o DTaP no debe recibir la Tdap, salvo que se encuentre una causa que no fuera la vacuna. Estas personas pueden recibir Td.   Consulte a su mdico si la persona que recibe [place] de las vacunas:   Tiene epilepsia o algn otro problema del sistema nervioso.   Tuvo inflamacin o dolor intenso despus de una dosis de DTP, DTaP, DT, Td, o Tdap.   Ha tenido el sndrome de Scientific laboratory technician (GBS por sus siglas en ingls).  Las personas que sufran una enfermedad moderada o grave el da en que se programa la vacuna, deben esperar a recuperarse para recibir las vacunas Tdap o Td. Por lo general, una persona con una enfermedad leve o fiebre baja puede recibir la vacuna. CULES SON LOS RIESGOS DE LAS VACUNAS TDAP Y TD? Con Cathleen Corti, al igual que con cualquier Automatic Data, siempre existe un pequeo riesgo de una reaccin alrgica que ponga en peligro la vida o cause otro problema grave. Todo procedimiento mdico, inclusive la vacunacin pueden causar breves episodios de lipotimia o sntomas relacionados (como movimientos espasmdicos). Para evitar los [employer] y las lesiones causadas por las  cadas, permanezca sentado o recustese durante los 15 minutos posteriores a la vacunacin. Informe a su mdico si el paciente se siente dbil o mareado, tiene cambios en la visin o siente zumbidos en los odos.  Es mucho ms probable que tener ttanos, difteria, o tos ferina cause problemas ms graves que los provocados por recibir cualquiera de las vacunas Td o Tdap. A continuacin se enumeran los problemas informados despus de las vacunas Td y Tdap. Problemas Leves (perceptibles, pero que no interfirieron con las actividades): Tdap  Dolor (alrededor de 3 de cada 4 adolescentes y 2 de cada 3 adultos).   Enrojecimiento o inflamacin en el sitio de la inyeccin (alrededor de 1 de cada 5).    Fiebre leve de al menos 100.4 F (38 C) (hasta alrededor de 1 cada 25 adolescentes y 1 de cada 100 adultos).   Dolor de cabeza (alrededor de 4 de cada 10 adolescentes y 3 de cada 10 adultos).   Cansancio (alrededor de 1 de cada 3 adolescentes y 1 de cada 4 adultos).   Nuseas, vmitos, diarrea, o dolor de estmago (hasta 1 de cada 4 adolescentes y 1 de cada 10 adultos).   Escalofros, dolores corporales, dolor articular, erupciones, o inflamacin de las glndulas (poco frecuente).  Td  Dolor (hasta alrededor de 8 de cada 10).   Enrojecimiento o inflamacin de la inyeccin (alrededor de 1 de cada 3).   Fiebre leve (hasta alrededor de 1 de cada 5).   Dolor de cabeza o cansancio (poco frecuente).  Problemas Moderados (interfieren con las [place], [place] no requieren atencin mdica): Tdap  Dolor en el sitio de la inyeccin (alrededor de 1 de cada 20 adolescentes y 1 de cada 100 adultos).   Enrojecimiento o inflamacin de la inyeccin (alrededor de 1 de cada 16 adolescentes y 1 de cada 25 adultos).   Fiebre de ms de 102 F (38.9 C) (alrededor de 1 de cada 100 adolescentes y 1 de cada 250 adultos).   Dolor de cabeza (1 de cada 300).   Nuseas, vmitos, diarrea, o dolor de estmago (hasta 3 de cada 100 adolescentes y 1 de cada 100 adultos).  Td  Fiebre de ms de 102 F (38.9 C) (poco comn).  Tdap o Td  Inflamacin de gran extensin en el brazo en el que se aplic la vacuna (hasta 3 de cada 100).  Problemas Graves (no puede realizar [employer]; requiere Psychologist, prison and probation services) Tdap o Td  Inflamacin, dolor intenso, sangrado y enrojecimiento en el brazo, en el sitio de la inyeccin (poco frecuente).  Puede producirse una reaccin alrgica grave despus de cualquier vacuna. Se estima que estas reacciones ocurren en menos de una de cada un milln de dosis. QU PASA SI HAY UNA REACCIN GRAVE? Qu signos debo buscar? Cualquier estado poco habitual, como una reaccin  alrgica grave o fiebre alta. Si le produce Runner, broadcasting/film/video grave, se manifestar dentro de algunos minutos a una hora despus de recibir la vacuna. Entre los signos de Automotive engineer grave se encuentran la dificultad para respirar, debilidad, ronquera o sibilancias, latidos cardacos acelerados, urticaria, mareos, palidez, o inflamacin de la garganta. Qu debo hacer?  Comunquese con su mdico o lleve inmediatamente a la persona al mdico.   Dgale a su mdico qu ocurri, la fecha y hora en que sucedi y cundo le aplicaron la vacuna.   Pida a su mdico que informe sobre la reaccin llenando un formulario del Sistema de Informacin de Reacciones Adversos a las [place] (VAERS,  por sus siglas en ingls). O, puede presentar este informe a travs del sitio web de VAERS [email or web address] o puede llamar al [phone].  VAERS no brinda asistencia mdica. PROGRAMA NACIONAL DE COMPENSACIN DE DAOS POR VACUNAS El [place] de Compensacin de Daos por Vacunas (VICP) fue creado en 1986.  Aquellas personas que consideren que han sufrido un dao como consecuencia de una vacuna y quieren saber ms acerca del programa y como presentar Roslynn Amble, [place] llamar al [phone] o visitar su sitio web en [email or web address]  CMO Roxan Diesel MS INFORMACIN?  El profesional podr darle el prospecto de la vacuna o sugerirle otras fuentes de informacin.   Comunquese con el servicio de salud de su localidad o [address].   Comunquese con los Centros para el control y la prevencin de Child psychotherapist for Disease Control and Prevention , CDC).   Llame al [phone] (1-800-CDC-INFO).   Visite los sitios web de Energy Transfer Partners en [email or web address]  CDC Td and Tdap Interim VIS-Spanish (03/28/10) Document Released: 06/07/2008 Document Revised: 02/08/2011 [facility] Patient Information 2012 [place], [place].

## 2011-11-02 LAB — URINALYSIS W MICROSCOPIC + REFLEX CULTURE
Glucose, UA: NEGATIVE mg/dL
Hgb urine dipstick: NEGATIVE
Leukocytes, UA: NEGATIVE
Nitrite: NEGATIVE
Protein, ur: NEGATIVE mg/dL
Urobilinogen, UA: 0.2 mg/dL (ref 0.0–1.0)

## 2011-11-02 LAB — TSH: TSH: 2.542 u[IU]/mL (ref 0.350–4.500)

## 2011-11-03 LAB — URINE CULTURE: Colony Count: 8000

## 2011-11-14 ENCOUNTER — Other Ambulatory Visit: Payer: Self-pay | Admitting: Gynecology

## 2011-11-14 DIAGNOSIS — R739 Hyperglycemia, unspecified: Secondary | ICD-10-CM

## 2011-11-16 ENCOUNTER — Other Ambulatory Visit: Payer: Self-pay

## 2011-11-16 DIAGNOSIS — R739 Hyperglycemia, unspecified: Secondary | ICD-10-CM

## 2011-11-16 LAB — GLUCOSE, RANDOM: Glucose, Bld: 102 mg/dL — ABNORMAL HIGH (ref 70–99)

## 2013-09-25 ENCOUNTER — Emergency Department (HOSPITAL_COMMUNITY): Payer: No Typology Code available for payment source

## 2013-09-25 ENCOUNTER — Emergency Department (HOSPITAL_COMMUNITY)
Admission: EM | Admit: 2013-09-25 | Discharge: 2013-09-25 | Disposition: A | Discharge status: Home / Self Care | Payer: No Typology Code available for payment source | Attending: Emergency Medicine | Admitting: Emergency Medicine

## 2013-09-25 ENCOUNTER — Encounter (HOSPITAL_COMMUNITY): Payer: Self-pay | Admitting: Emergency Medicine

## 2013-09-25 DIAGNOSIS — Y92410 Unspecified street and highway as the place of occurrence of the external cause: Secondary | ICD-10-CM | POA: Insufficient documentation

## 2013-09-25 DIAGNOSIS — S0993XA Unspecified injury of face, initial encounter: Secondary | ICD-10-CM | POA: Insufficient documentation

## 2013-09-25 DIAGNOSIS — S139XXA Sprain of joints and ligaments of unspecified parts of neck, initial encounter: Secondary | ICD-10-CM | POA: Insufficient documentation

## 2013-09-25 DIAGNOSIS — S199XXA Unspecified injury of neck, initial encounter: Secondary | ICD-10-CM

## 2013-09-25 DIAGNOSIS — M545 Low back pain: Secondary | ICD-10-CM

## 2013-09-25 DIAGNOSIS — Y9389 Activity, other specified: Secondary | ICD-10-CM | POA: Insufficient documentation

## 2013-09-25 DIAGNOSIS — IMO0002 Reserved for concepts with insufficient information to code with codable children: Secondary | ICD-10-CM | POA: Insufficient documentation

## 2013-09-25 DIAGNOSIS — S134XXA Sprain of ligaments of cervical spine, initial encounter: Secondary | ICD-10-CM

## 2013-09-25 MED ORDER — TRAMADOL HCL 50 MG PO TABS: 50.0000 mg | ORAL_TABLET | Freq: Four times a day (QID) | ORAL | Status: DC | PRN

## 2013-09-25 MED ORDER — NAPROXEN 375 MG PO TABS
375.0000 mg | ORAL_TABLET | Freq: Two times a day (BID) | ORAL | Status: DC
Start: 2013-09-25 — End: 2015-08-15

## 2013-09-25 MED ORDER — CYCLOBENZAPRINE HCL 10 MG PO TABS: 10.0000 mg | ORAL_TABLET | Freq: Two times a day (BID) | ORAL | Status: DC | PRN

## 2013-09-25 MED ORDER — ACETAMINOPHEN 325 MG PO TABS
650.0000 mg | ORAL_TABLET | Freq: Once | ORAL | Status: AC
  Administered 2013-09-25: 650 mg via ORAL
  Filled 2013-09-25: qty 2
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
## 2013-09-25 NOTE — Progress Notes (Signed)
Chaplain referred by Director to check [facility] Pt and [facility] Mother and children were alert and responsive. Chaplain informed pt of care available if desired. Pt grateful for the visit.  Anita RomneyLarry J Brown

## 2013-09-25 NOTE — ED Provider Notes (Signed)
  Medical screening examination/treatment/procedure(s) were performed by non-physician practitioner and as supervising physician I was immediately available for consultation/collaboration.   EKG Interpretation None         Arshi Duarte, MD 09/25/13 1516 
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
## 2014-01-04 ENCOUNTER — Encounter (HOSPITAL_COMMUNITY): Payer: Self-pay | Admitting: Emergency Medicine

## 2015-08-14 ENCOUNTER — Encounter (HOSPITAL_COMMUNITY): Payer: Self-pay | Admitting: Emergency Medicine

## 2015-08-14 DIAGNOSIS — R51 Headache: Secondary | ICD-10-CM | POA: Insufficient documentation

## 2015-08-14 NOTE — ED Notes (Signed)
Pt. reports chronic left side headache radiating to left face for >1 month , denies head injury , denies nausea , emesis or photophobia . No fever or chills.

## 2015-08-15 ENCOUNTER — Emergency Department (HOSPITAL_COMMUNITY)
Admission: EM | Admit: 2015-08-15 | Discharge: 2015-08-15 | Disposition: A | Discharge status: Home / Self Care | Payer: No Typology Code available for payment source | Attending: Emergency Medicine | Admitting: Emergency Medicine

## 2015-08-15 ENCOUNTER — Emergency Department (HOSPITAL_COMMUNITY): Payer: No Typology Code available for payment source

## 2015-08-15 DIAGNOSIS — R519 Headache, unspecified: Secondary | ICD-10-CM

## 2015-08-15 DIAGNOSIS — R51 Headache: Secondary | ICD-10-CM

## 2015-08-15 LAB — SEDIMENTATION RATE: Sed Rate: 10 mm/hr (ref 0–22)

## 2015-08-15 LAB — BASIC METABOLIC PANEL
Anion gap: 6 (ref 5–15)
BUN: 17 mg/dL (ref 6–20)
CALCIUM: 8.5 mg/dL — AB (ref 8.9–10.3)
CO2: 25 mmol/L (ref 22–32)
Chloride: 107 mmol/L (ref 101–111)
Creatinine, Ser: 0.83 mg/dL (ref 0.44–1.00)
GFR calc Af Amer: >60 mL/min (ref >60)
GLUCOSE: 101 mg/dL — AB (ref 65–99)
Potassium: 3.6 mmol/L (ref 3.5–5.1)
SODIUM: 138 mmol/L (ref 135–145)

## 2015-08-15 LAB — CBC WITH DIFFERENTIAL/PLATELET
Basophils Absolute: 0 10*3/uL (ref 0.0–0.1)
Basophils Relative: 0 %
Eosinophils Absolute: 0.5 10*3/uL (ref 0.0–0.7)
Eosinophils Relative: 7 %
HCT: 37.2 % (ref 36.0–46.0)
Hemoglobin: 11.7 g/dL — ABNORMAL LOW (ref 12.0–15.0)
Lymphocytes Relative: 38 %
Lymphs Abs: 2.9 10*3/uL (ref 0.7–4.0)
MCH: 26.4 pg (ref 26.0–34.0)
MCHC: 31.5 g/dL (ref 30.0–36.0)
MCV: 84 fL (ref 78.0–100.0)
Monocytes Absolute: 0.5 10*3/uL (ref 0.1–1.0)
Monocytes Relative: 7 %
Neutro Abs: 3.6 10*3/uL (ref 1.7–7.7)
Neutrophils Relative %: 48 %
Platelets: 168 10*3/uL (ref 150–400)
RBC: 4.43 MIL/uL (ref 3.87–5.11)
RDW: 13.5 % (ref 11.5–15.5)
WBC: 7.6 10*3/uL (ref 4.0–10.5)

## 2015-08-15 LAB — C-REACTIVE PROTEIN: CRP: 0.6 mg/dL (ref <1.0)

## 2015-08-15 LAB — CBG MONITORING, ED: Glucose-Capillary: 98 mg/dL (ref 65–99)

## 2015-08-15 MED ORDER — DIPHENHYDRAMINE HCL 50 MG/ML IJ SOLN
25.0000 mg | Freq: Once | INTRAMUSCULAR | Status: AC
  Administered 2015-08-15: 25 mg via INTRAVENOUS
  Filled 2015-08-15: qty 1

## 2015-08-15 MED ORDER — PROCHLORPERAZINE EDISYLATE 5 MG/ML IJ SOLN
10.0000 mg | Freq: Once | INTRAMUSCULAR | Status: AC
  Administered 2015-08-15: 10 mg via INTRAVENOUS
  Filled 2015-08-15: qty 2

## 2015-08-15 MED ORDER — SODIUM CHLORIDE 0.9 % IV BOLUS (SEPSIS)
1000.0000 mL | Freq: Once | INTRAVENOUS | Status: AC
  Administered 2015-08-15: 1000 mL via INTRAVENOUS

## 2015-08-15 NOTE — ED Notes (Signed)
Pt departed in NAD.  

## 2015-08-15 NOTE — ED Provider Notes (Signed)
CSN: [id number]     Arrival date & time 08/14/15  2309 History   First MD Initiated Contact with Patient 08/15/15 0121     Chief Complaint  Patient presents with  . Headache     (Consider location/radiation/quality/duration/timing/severity/associated sxs/prior Treatment) The history is provided by the patient and medical records. No language interpreter was used.     Anita Wheeler is a 45 y.o. female  with a hx of Cholecystectomy presents to the Emergency Department complaining of gradual, persistent, progressively worsening left-sided headache onset greater than one month ago. Patient reports she has seen her primary physician and been given several different medications without relief. She cannot remember what medication she has taken.  She does report taking ibuprofen without relief. She denies visual acuity changes, nausea, vomiting, photophobia, phonophobia, room spinning, syncope. She reports headache waxes and wanes throughout the day but does not ever resolved. No aggravating or alleviating factors.  Pt denies fever, chills, neck pain, neck stiffness, chest pain, shortness of breath, abdominal pain, nausea, vomiting, diarrhea.     History reviewed. No pertinent past medical history. Past Surgical History  Procedure Laterality Date  . Colon surgery  2003  . Cholecystectomy    . Bowel resesection      diverticulitis   Family History  Problem Relation Age of Onset  . Diabetes Mother   . Heart disease Brother    Social History  Substance Use Topics  . Smoking status: Never Smoker   . Smokeless tobacco: Never Used  . Alcohol Use: No   OB History    Gravida Para Term Preterm AB TAB SAB Ectopic Multiple Living   3 3 3       3      Review of Systems  Constitutional: Negative for fever, diaphoresis, appetite change, fatigue and unexpected weight change.  HENT: Negative for mouth sores.   Eyes: Negative for visual disturbance.  Respiratory: Negative for cough, chest  tightness, shortness of breath and wheezing.   Cardiovascular: Negative for chest pain.  Gastrointestinal: Negative for nausea, vomiting, abdominal pain, diarrhea and constipation.  Endocrine: Negative for polydipsia, polyphagia and polyuria.  Genitourinary: Negative for dysuria, urgency, frequency and hematuria.  Musculoskeletal: Negative for back pain and neck stiffness.  Skin: Negative for rash.  Allergic/Immunologic: Negative for immunocompromised state.  Neurological: Positive for headaches. Negative for syncope and light-headedness.  Hematological: Does not bruise/bleed easily.  Psychiatric/Behavioral: Negative for sleep disturbance. The patient is not nervous/anxious.       Allergies  Review of patient's allergies indicates no known allergies.  Home Medications   Prior to Admission medications   Medication Sig Start Date End Date Taking? Authorizing Provider  ibuprofen (ADVIL,MOTRIN) 200 MG tablet Take 400 mg by mouth every 6 (six) hours as needed for moderate pain.   Yes Historical Provider, MD   BP 90/57 mmHg  Pulse 61  Temp(Src) 97.8 F (36.6 C) (Oral)  Resp 12  Wt 84.171 kg  SpO2 100%  LMP 08/07/2015 (Approximate) Physical Exam  Constitutional: She is oriented to person, place, and time. She appears well-developed and well-nourished. No distress.  HENT:  Head: Normocephalic and atraumatic.  Nose: Nose normal.  Mouth/Throat: Oropharynx is clear and moist.  TTP along the left temporal area; no bruit noted No rash to the face or scalp No open lesions to the face or scalp  Eyes: Conjunctivae and EOM are normal. Pupils are equal, round, and reactive to light. No scleral icterus.  No horizontal, vertical or rotational nystagmus  Visual acuity:  Neck: Normal range of motion. Neck supple.  Full active and passive ROM without pain No midline or paraspinal tenderness No nuchal rigidity or meningeal signs  Cardiovascular: Normal rate, regular rhythm, normal heart  sounds and intact distal pulses.   No murmur heard. Pulmonary/Chest: Effort normal and breath sounds normal. No respiratory distress. She has no wheezes. She has no rales.  Abdominal: Soft. Bowel sounds are normal. There is no tenderness. There is no rebound and no guarding.  Musculoskeletal: Normal range of motion.  Lymphadenopathy:    She has no cervical adenopathy.  Neurological: She is alert and oriented to person, place, and time. She has normal reflexes. No cranial nerve deficit. She exhibits normal muscle tone. Coordination normal.  Mental Status:  Alert, oriented, thought content appropriate. Speech fluent without evidence of aphasia. Able to follow 2 step commands without difficulty.  Cranial Nerves:  II:  Peripheral visual fields grossly normal, pupils equal, round, reactive to light III,IV, VI: ptosis not present, extra-ocular motions intact bilaterally  V,VII: smile symmetric, facial light touch sensation equal VIII: hearing grossly normal bilaterally  IX,X: midline uvula rise  XI: bilateral shoulder shrug equal and strong XII: midline tongue extension  Motor:  5/5 in upper and lower extremities bilaterally including strong and equal grip strength and dorsiflexion/plantar flexion Sensory: Pinprick and light touch normal in all extremities.  Deep Tendon Reflexes: 2+ and symmetric  Cerebellar: normal finger-to-nose with bilateral upper extremities Gait: normal gait and balance CV: distal pulses palpable throughout   Skin: Skin is warm and dry. No rash noted. She is not diaphoretic. No erythema.  Psychiatric: She has a normal mood and affect. Her behavior is normal. Judgment and thought content normal.  Nursing note and vitals reviewed.   ED Course  Procedures (including critical care time) Labs Review Labs Reviewed  CBC WITH DIFFERENTIAL/PLATELET - Abnormal; Notable for the following:    Hemoglobin 11.7 (*)    All other components within normal limits  BASIC METABOLIC  PANEL - Abnormal; Notable for the following:    Glucose, Bld 101 (*)    Calcium 8.5 (*)    All other components within normal limits  SEDIMENTATION RATE  C-REACTIVE PROTEIN  CBG MONITORING, ED    Imaging Review Ct Head Wo Contrast  08/15/2015  CLINICAL DATA:  Persistent headache for a month. EXAM: CT HEAD WITHOUT CONTRAST TECHNIQUE: Contiguous axial images were obtained from the base of the skull through the vertex without intravenous contrast. COMPARISON:  12/24/2008 FINDINGS: Ventricles and sulci are symmetrical. No ventricular dilatation. No mass effect or midline shift. No abnormal extra-axial fluid collections. Gray-white matter junctions are distinct. Basal cisterns are not effaced. No evidence of acute intracranial hemorrhage. No depressed skull fractures. Visualized paranasal sinuses and mastoid air cells are not opacified. IMPRESSION: No acute intracranial abnormalities. Electronically Signed   By: Burman Nieves M.D.   On: 08/15/2015 03:41   I have personally reviewed and evaluated these images and lab results as part of my medical decision-making.    MDM   Final diagnoses:  Nonintractable headache, unspecified chronicity pattern, unspecified headache type   Anita Wheeler presents with headache x1 month.  Pt HA treated and improved while in ED.  Presentation is non concerning for [facility], [facility], Meningitis.  Concern for possible temporal arteritis, but no vision changes x 1 mo makes this less likely.  Sed Rate and CRP WNL.  Doubt this is the reason for her headache.  CT head without acute abnormality.  Pt is afebrile with no focal neuro deficits, nuchal rigidity, or change in vision. Pt is to follow up with neurology for further evaluation.  Discussed reasons to return with both pt and daughter. Pt verbalizes understanding and is agreeable with plan to dc.     Dahlia Client Herminia Warren, PA-C 08/15/15 1610  Shon Baton, MD 08/15/15 [phone]

## 2015-08-15 NOTE — Discharge Instructions (Signed)
1. Medications: usual home medications 2. Treatment: rest, drink plenty of fluids,  3. Follow Up: Please followup with Dr. Rema Jasminelukayode O Onasanya in 3 days for discussion of your diagnoses and further evaluation after today's visit; if you do not have a primary care doctor use the resource guide provided to find one; Please return to the ER for worsening symptoms, vision changes, persistent vomiting or other concerns

## 2015-08-15 NOTE — ED Notes (Signed)
Patient transported to CT 

## 2015-08-15 NOTE — ED Notes (Signed)
Patient states she has been having constant headaches for 1 month now. PCP put her on several pain meds and hasn't helped.

## 2017-09-16 ENCOUNTER — Encounter (HOSPITAL_COMMUNITY): Payer: Self-pay | Admitting: Emergency Medicine

## 2017-09-16 ENCOUNTER — Ambulatory Visit (HOSPITAL_COMMUNITY)
Admission: EM | Admit: 2017-09-16 | Discharge: 2017-09-16 | Disposition: A | Discharge status: Home / Self Care | Payer: Self-pay | Attending: Family Medicine | Admitting: Family Medicine

## 2017-09-16 ENCOUNTER — Ambulatory Visit (INDEPENDENT_AMBULATORY_CARE_PROVIDER_SITE_OTHER): Payer: Self-pay

## 2017-09-16 DIAGNOSIS — M5441 Lumbago with sciatica, right side: Secondary | ICD-10-CM

## 2017-09-16 MED ORDER — PREDNISONE 10 MG (21) PO TBPK: ORAL_TABLET | Freq: Every day | ORAL | 0 refills | Status: DC

## 2017-09-16 MED ORDER — IBUPROFEN 800 MG PO TABS
ORAL_TABLET | ORAL | Status: AC
  Filled 2017-09-16: qty 1

## 2017-09-16 MED ORDER — IBUPROFEN 800 MG PO TABS
800.0000 mg | ORAL_TABLET | Freq: Once | ORAL | Status: AC
  Administered 2017-09-16: 800 mg via ORAL

## 2017-09-16 NOTE — ED Provider Notes (Signed)
[facility]    CSN: [id number] Arrival date & time: 09/16/17  1047     History   Chief Complaint Chief Complaint  Patient presents with  . Back Pain    HPI Anita Wheeler is a 47 y.o. female.   Patient is a healthy 47 year old female with 2 weeks of right lower back pain.  This has been constant and worsening with radiation into right buttocks, hip, leg.  She denies any numbness, tingling, weakness.  She denies any problems with bowel or bladder.  She denies any history of heavy lifting or injury to the back.  She has been seeing a chiropractor which she saw last week.  She was told that she would need an x-ray to have any further adjustments of the back done.  She has been taking Advil and Aleve with minimal relief.  ROS per HPI      History reviewed. No pertinent past medical history.  Patient Active Problem List   Diagnosis Date Noted  . Weight gain 11/01/2011  . Routine gynecological examination 11/01/2011  . DEPRESSIVE DISORDER, NOS 05/02/2006  . DIVERTICULITIS OF COLON, NOS 05/02/2006    Past Surgical History:  Procedure Laterality Date  . bowel resesection     diverticulitis  . CHOLECYSTECTOMY    . COLON SURGERY  2003    OB History    Gravida  3   Para  3   Term  3   Preterm      AB      Living  3     SAB      TAB      Ectopic      Multiple      Live Births  3            Home Medications    Prior to Admission medications   Medication Sig Start Date End Date Taking? Authorizing Provider  ibuprofen (ADVIL,MOTRIN) 200 MG tablet Take 400 mg by mouth every 6 (six) hours as needed for moderate pain.    [provider]  predniSONE (STERAPRED UNI-PAK 21 TAB) 10 MG (21) TBPK tablet Take by mouth daily. Take 6 tabs by mouth daily  for 2 days, then 5 tabs for 2 days, then 4 tabs for 2 days, then 3 tabs for 2 days, 2 tabs for 2 days, then 1 tab by mouth daily for 2 days 09/16/17   Janace Aris, NP    Family  History Family History  Problem Relation Age of Onset  . Diabetes Mother   . Heart disease Brother     Social History Social History   Tobacco Use  . Smoking status: Never Smoker  . Smokeless tobacco: Never Used  Substance Use Topics  . Alcohol use: No  . Drug use: No     Allergies   Patient has no known allergies.   Review of Systems Review of Systems   Physical Exam Triage Vital Signs ED Triage Vitals [09/16/17 1110]  Enc Vitals Group     BP 126/81     Pulse Rate 65     Resp 16     Temp 98.1 F (36.7 C)     Temp src      SpO2 99 %     Weight      Height      Head Circumference      Peak Flow      Pain Score      Pain Loc  Pain Edu?      Excl. in GC?    No data found.  Updated Vital Signs BP 126/81   Pulse 65   Temp 98.1 F (36.7 C)   Resp 16   LMP 07/17/2017 (Approximate) Comment: denies pregnancy  SpO2 99%   Visual Acuity Right Eye Distance:   Left Eye Distance:   Bilateral Distance:    Right Eye Near:   Left Eye Near:    Bilateral Near:     Physical Exam  Constitutional: She is oriented to person, place, and time. She appears well-developed and well-nourished.  HENT:  Head: Normocephalic and atraumatic.  Neck: Normal range of motion.  Pulmonary/Chest: Effort normal.  Abdominal: Soft. She exhibits no distension and no mass. There is no tenderness. There is no rebound and no guarding. No hernia.  Musculoskeletal: She exhibits no edema, tenderness or deformity.  Right sided straight leg test positive.  Pain with flexion, extension, rotation of the spine.  No swelling, erythema, ecchymosis.  Tenderness to palpation of lumbar spine and para vertebral muscles of lumbar spine.  Sciatic notch tenderness.  Neurological: She is alert and oriented to person, place, and time.  Skin: Skin is warm and dry. Capillary refill takes less than 2 seconds.  Psychiatric: She has a normal mood and affect.     UC Treatments / Results  Labs (all labs  ordered are listed, but only abnormal results are displayed) Labs Reviewed - No data to display  EKG None  Radiology Dg Lumbar Spine Complete  Result Date: 09/16/2017 CLINICAL DATA:  Lumbar spine pain for 2 weeks, low back pain transversely and more towards RIGHT hip radiating to distal RIGHT leg EXAM: LUMBAR SPINE - COMPLETE 4+ VIEW COMPARISON:  09/25/2013 FINDINGS: Hypoplastic last rib pair. Five non-rib-bearing lumbar vertebra. Osseous mineralization grossly normal for technique. Disc space narrowing L4-L5 and L5-S1. Vertebral body heights maintained without fracture, subluxation or bone destruction. No spondylolysis. SI joints preserved. 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 Otherwise negative exam. Electronically Signed   By: Ulyses SouthwardMark  Boles M.D.   On: 09/16/2017 12:49    Procedures Procedures (including critical care time)  Medications Ordered in UC Medications  ibuprofen (ADVIL,MOTRIN) tablet 800 mg (800 mg Oral Given 09/16/17 1231)    Initial Impression / Assessment and Plan / UC Course  I have reviewed the triage vital signs and the nursing notes.  Pertinent labs & imaging results that were available during my care of the patient were reviewed by me and considered in my medical decision making (see chart for details).     Obtaining x-ray due to lumbar spine tenderness and patient request.  Most likely cause of pain is sciatic nerve compression.  If x-ray is negative we will treat with prednisone taper and have follow up with chiropractor.  X-ray showed mild degenerative disc disease in the lower lumbar spine.  We will continue with prednisone taper and have her follow-up with a chiropractor as needed.  Return precautions given. Final Clinical Impressions(s) / UC Diagnoses   Final diagnoses:  Acute right-sided low back pain with right-sided sciatica     Discharge Instructions     It was nice meeting you!!  Your xray showed some mild  degenerative disc disease. No fracture.  I am going to treat you with a steroid taper to hopefully improve the pain and inflammation you are having.  I will also give you a list of back exercises to try.  Please follow up with  your chiropractor as needed.  If you develop any worsening pain, or loss of bowel or bladder control please go the ER.     ED Prescriptions    Medication Sig Dispense Auth. Provider   predniSONE (STERAPRED UNI-PAK 21 TAB) 10 MG (21) TBPK tablet Take by mouth daily. Take 6 tabs by mouth daily  for 2 days, then 5 tabs for 2 days, then 4 tabs for 2 days, then 3 tabs for 2 days, 2 tabs for 2 days, then 1 tab by mouth daily for 2 days 42 tablet Kenzy Campoverde A, NP     Controlled Substance Prescriptions [place] Controlled Substance Registry consulted? Not Applicable   Janace Aris, NP 09/16/17 1408

## 2017-09-16 NOTE — ED Triage Notes (Signed)
Pt c/o R lower back pain that travels down her R leg, x2 weeks. Pain worse with movement.

## 2017-09-16 NOTE — Discharge Instructions (Addendum)
It was nice meeting you!!  Your xray showed some mild degenerative disc disease. No fracture.  I am going to treat you with a steroid taper to hopefully improve the pain and inflammation you are having.  I will also give you a list of back exercises to try.  Please follow up with your chiropractor as needed.  If you develop any worsening pain, or loss of bowel or bladder control please go the ER.

## 2017-11-11 ENCOUNTER — Emergency Department (HOSPITAL_COMMUNITY)
Admission: EM | Admit: 2017-11-11 | Discharge: 2017-11-11 | Disposition: A | Discharge status: Home / Self Care | Payer: Self-pay | Attending: Emergency Medicine | Admitting: Emergency Medicine

## 2017-11-11 ENCOUNTER — Encounter (HOSPITAL_COMMUNITY): Payer: Self-pay | Admitting: Emergency Medicine

## 2017-11-11 DIAGNOSIS — M5441 Lumbago with sciatica, right side: Secondary | ICD-10-CM | POA: Insufficient documentation

## 2017-11-11 DIAGNOSIS — Z79899 Other long term (current) drug therapy: Secondary | ICD-10-CM | POA: Insufficient documentation

## 2017-11-11 DIAGNOSIS — G8929 Other chronic pain: Secondary | ICD-10-CM

## 2017-11-11 MED ORDER — PREDNISONE 20 MG PO TABS: 40.0000 mg | ORAL_TABLET | Freq: Every day | ORAL | 0 refills | Status: AC

## 2017-11-11 MED ORDER — METHOCARBAMOL 750 MG PO TABS: 750.0000 mg | ORAL_TABLET | Freq: Three times a day (TID) | ORAL | 0 refills | Status: AC | PRN

## 2017-11-11 MED ORDER — DIAZEPAM 5 MG PO TABS
5.0000 mg | ORAL_TABLET | Freq: Once | ORAL | Status: AC
  Administered 2017-11-11: 5 mg via ORAL
  Filled 2017-11-11: qty 1

## 2017-11-11 MED ORDER — ACETAMINOPHEN 500 MG PO TABS
1000.0000 mg | ORAL_TABLET | Freq: Once | ORAL | Status: AC
  Administered 2017-11-11: 1000 mg via ORAL
  Filled 2017-11-11: qty 2

## 2017-11-11 MED ORDER — IBUPROFEN 400 MG PO TABS
400.0000 mg | ORAL_TABLET | Freq: Once | ORAL | Status: AC | PRN
  Administered 2017-11-11: 400 mg via ORAL
  Filled 2017-11-11: qty 1

## 2017-11-11 NOTE — ED Notes (Signed)
ED Provider at bedside. 

## 2017-11-11 NOTE — ED Provider Notes (Signed)
[facility] EMERGENCY DEPARTMENT Provider Note   CSN: [id number] Arrival date & time: 11/11/17  1930     History   Chief Complaint Chief Complaint  Patient presents with  . Back Pain  . Leg Pain    HPI Anita Wheeler is a 47 y.o. female who presents today for evaluation of right-sided lower back pain radiating down her right leg.  Chart review shows that she was seen at urgent care for this in July and given a steroid prescription which she says did not help.  She denies any specific injury or new injury, rather says that this is been worsening consistently.  She denies any changes to bowel or bladder function, no numbness or tingling across upper inner thighs or genitals.  She denies any abdominal pain, no nausea vomiting or diarrhea.  She denies possibility of pregnancy.  She denies personal history of cancer or IV drug use.   HPI  History reviewed. No pertinent past medical history.  Patient Active Problem List   Diagnosis Date Noted  . Weight gain 11/01/2011  . Routine gynecological examination 11/01/2011  . DEPRESSIVE DISORDER, NOS 05/02/2006  . DIVERTICULITIS OF COLON, NOS 05/02/2006    Past Surgical History:  Procedure Laterality Date  . bowel resesection     diverticulitis  . CHOLECYSTECTOMY    . COLON SURGERY  2003     OB History    Gravida  3   Para  3   Term  3   Preterm      AB      Living  3     SAB      TAB      Ectopic      Multiple      Live Births  3            Home Medications    Prior to Admission medications   Medication Sig Start Date End Date Taking? Authorizing Provider  ibuprofen (ADVIL,MOTRIN) 200 MG tablet Take 400 mg by mouth every 6 (six) hours as needed for moderate pain.    [provider]  methocarbamol (ROBAXIN) 750 MG tablet Take 1-2 tablets (750-1,500 mg total) by mouth 3 (three) times daily as needed for muscle spasms. 11/11/17   Cristina Gong, PA-C  predniSONE (DELTASONE) 20  MG tablet Take 2 tablets (40 mg total) by mouth daily for 5 days. 11/11/17 11/16/17  Cristina Gong, PA-C    Family History Family History  Problem Relation Age of Onset  . Diabetes Mother   . Heart disease Brother     Social History Social History   Tobacco Use  . Smoking status: Never Smoker  . Smokeless tobacco: Never Used  Substance Use Topics  . Alcohol use: No  . Drug use: No     Allergies   Patient has no known allergies.   Review of Systems Review of Systems  Constitutional: Negative for chills and fever.  Cardiovascular: Negative for chest pain.  Gastrointestinal: Negative for abdominal pain, diarrhea, nausea and vomiting.  Genitourinary: Negative for difficulty urinating, dysuria, vaginal bleeding, vaginal discharge and vaginal pain.  Musculoskeletal: Positive for back pain.  Skin: Negative for color change and wound.  Neurological: Negative for weakness and numbness.  All other systems reviewed and are negative.    Physical Exam Updated Vital Signs BP (!) 148/87 (BP Location: Right Arm)   Pulse (!) 56   Temp 98.2 F (36.8 C) (Oral)   Resp 20  LMP 10/11/2017   SpO2 99%   Physical Exam  Constitutional: She appears well-developed and well-nourished. No distress.  HENT:  Head: Normocephalic and atraumatic.  Cardiovascular: Normal rate, regular rhythm and intact distal pulses.  Pulmonary/Chest: Effort normal. No stridor. No respiratory distress.  Abdominal: She exhibits no distension. There is no tenderness. There is no guarding.  Musculoskeletal: She exhibits no edema or deformity.  There is diffuse tenderness to palpation over right-sided paraspinal muscles into right buttock including right piriformis muscle.  Palpation over these areas both re-creates and exacerbates her reported pain including the radiation down her right leg.  No midline L-spine tenderness to palpation.  5/5 strength in bilateral lower extremities.  Neurological: She is alert.  No sensory deficit. She exhibits normal muscle tone.  Sensation intact to bilateral lower extremities.  Skin: Skin is warm and dry. She is not diaphoretic.  Psychiatric: She has a normal mood and affect. Her behavior is normal.  Nursing note and vitals reviewed.    ED Treatments / Results  Labs (all labs ordered are listed, but only abnormal results are displayed) Labs Reviewed - No data to display  EKG None  Radiology No results found.  Procedures Procedures (including critical care time)  Medications Ordered in ED Medications  ibuprofen (ADVIL,MOTRIN) tablet 400 mg (400 mg Oral Given 11/11/17 1958)  diazepam (VALIUM) tablet 5 mg (5 mg Oral Given 11/11/17 2234)  acetaminophen (TYLENOL) tablet 1,000 mg (1,000 mg Oral Given 11/11/17 2234)     Initial Impression / Assessment and Plan / ED Course  I have reviewed the triage vital signs and the nursing notes.  Pertinent labs & imaging results that were available during my care of the patient were reviewed by me and considered in my medical decision making (see chart for details).    Patient with back pain.  Back pain has been present for over 6 weeks and no known injury.  No neurological deficits and normal neuro exam.  Patient can walk but states is painful.  No loss of bowel or bladder control.  No concern for cauda equina.  No fever, night sweats, weight loss, h/o cancer, IVDU.  RICE protocol and pain medicine indicated and discussed with patient.  Strength is the need to obtain primary care provider, and pursue PT, and/or outpatient MRI.  No indications for emergent MRI at this time.  She was offered time to see if the medicines help, however she requests to take the medicines and then go home to rest in her own bed.  Return precautions were discussed with patient who states their understanding.  At the time of discharge patient denied any unaddressed complaints or concerns.  Patient is agreeable for discharge home.     Final  Clinical Impressions(s) / ED Diagnoses   Final diagnoses:  Chronic right-sided low back pain with right-sided sciatica    ED Discharge Orders         Ordered    methocarbamol (ROBAXIN) 750 MG tablet  3 times daily PRN     11/11/17 2233    predniSONE (DELTASONE) 20 MG tablet  Daily     11/11/17 2233           Cristina Gong, PA-C 11/12/17 0153    Gwyneth Sprout, MD 11/12/17 1411

## 2017-11-11 NOTE — Discharge Instructions (Addendum)
Today you received medications that may make you sleepy or impair your ability to make decisions.  For the next 24 hours please do not drive, operate heavy machinery, care for a small child with out another adult present, or perform any activities that may cause harm to you or someone else if you were to fall asleep or be impaired.  ° °You are being prescribed a medication which may make you sleepy. Please follow up of listed precautions for at least 24 hours after taking one dose. ° °Please take Ibuprofen (Advil, motrin) and Tylenol (acetaminophen) to relieve your pain.  You may take up to 600 MG (3 pills) of normal strength ibuprofen every 8 hours as needed.  In between doses of ibuprofen you make take tylenol, up to 1,000 mg (two extra strength pills).  Do not take more than 3,000 mg tylenol in a 24 hour period.  Please check all medication labels as many medications such as pain and cold medications may contain tylenol.  Do not drink alcohol while taking these medications.  Do not take other NSAID'S while taking ibuprofen (such as aleve or naproxen).  Please take ibuprofen with food to decrease stomach upset. ° °I have given you a prescription for steroids today.  Some common side effects include feelings of extra energy, feeling warm, increased appetite, and stomach upset.  If you are diabetic your sugars may run higher than usual.  ° °

## 2017-11-11 NOTE — ED Triage Notes (Signed)
Pt presents with lower back pain that radiates down R leg x 1 month; denies any exacerbating events today to make pain worse; was seen at [facility] and given pain meds but arent working; pt denies numbness, incontinence

## 2018-12-24 IMAGING — DX DG LUMBAR SPINE COMPLETE 4+V
5 series · 5 of 5 positions shown · non-contrast
Comparison: 09/25/2013

CLINICAL DATA: Lumbar spine pain for 2 weeks, low back pain
transversely and more towards RIGHT hip radiating to distal RIGHT
leg

EXAM:
LUMBAR SPINE - COMPLETE 4+ VIEW

[l-spine ap]
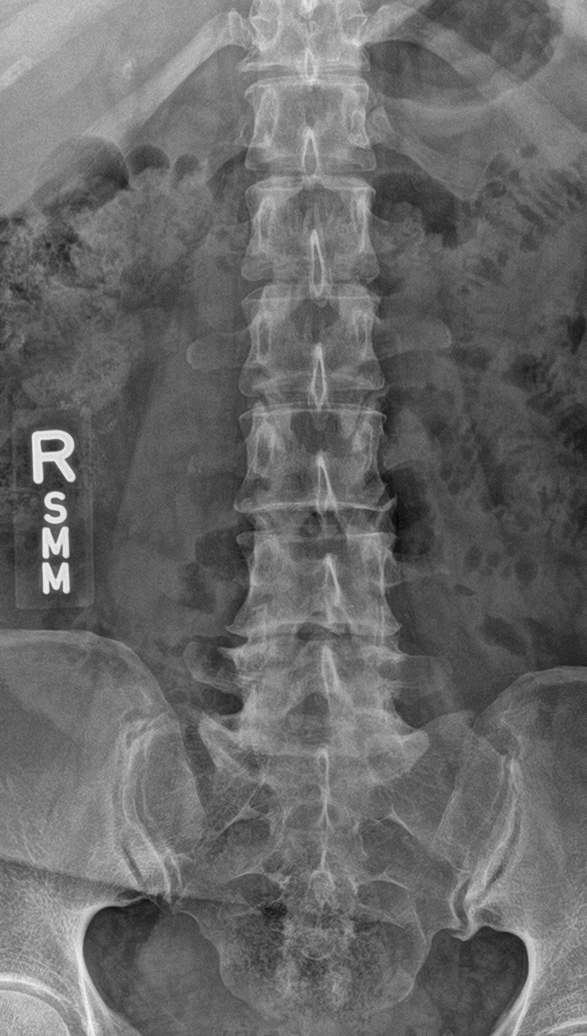

[l-spine obl (1 of 2)]
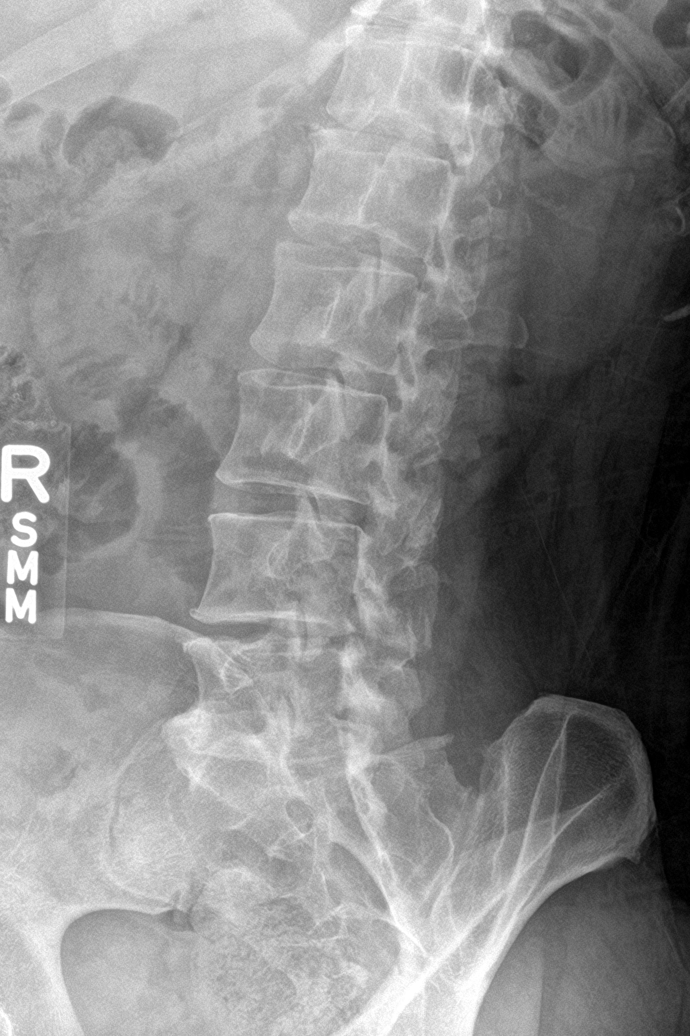

[l-spine obl (2 of 2)]
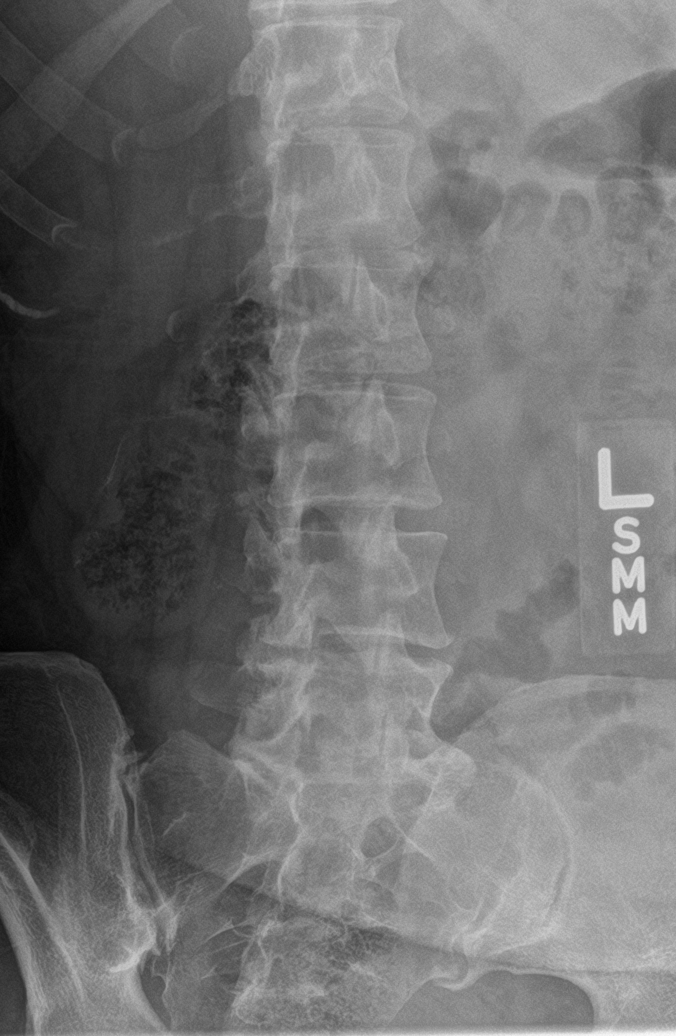

[l-spine lat]
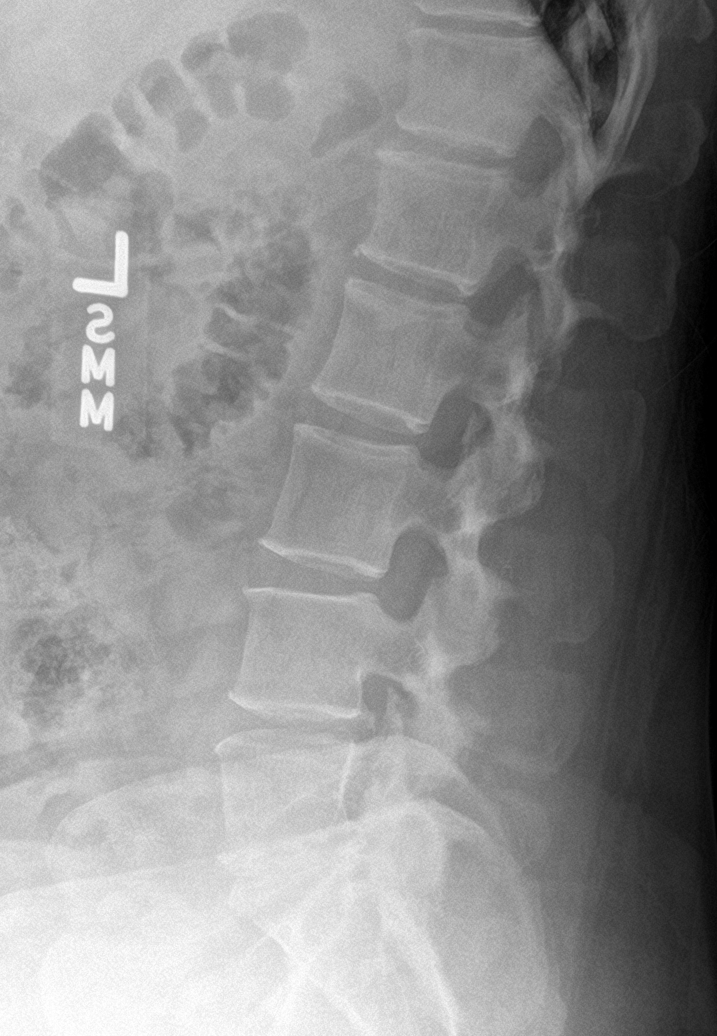

[l-spine spot]
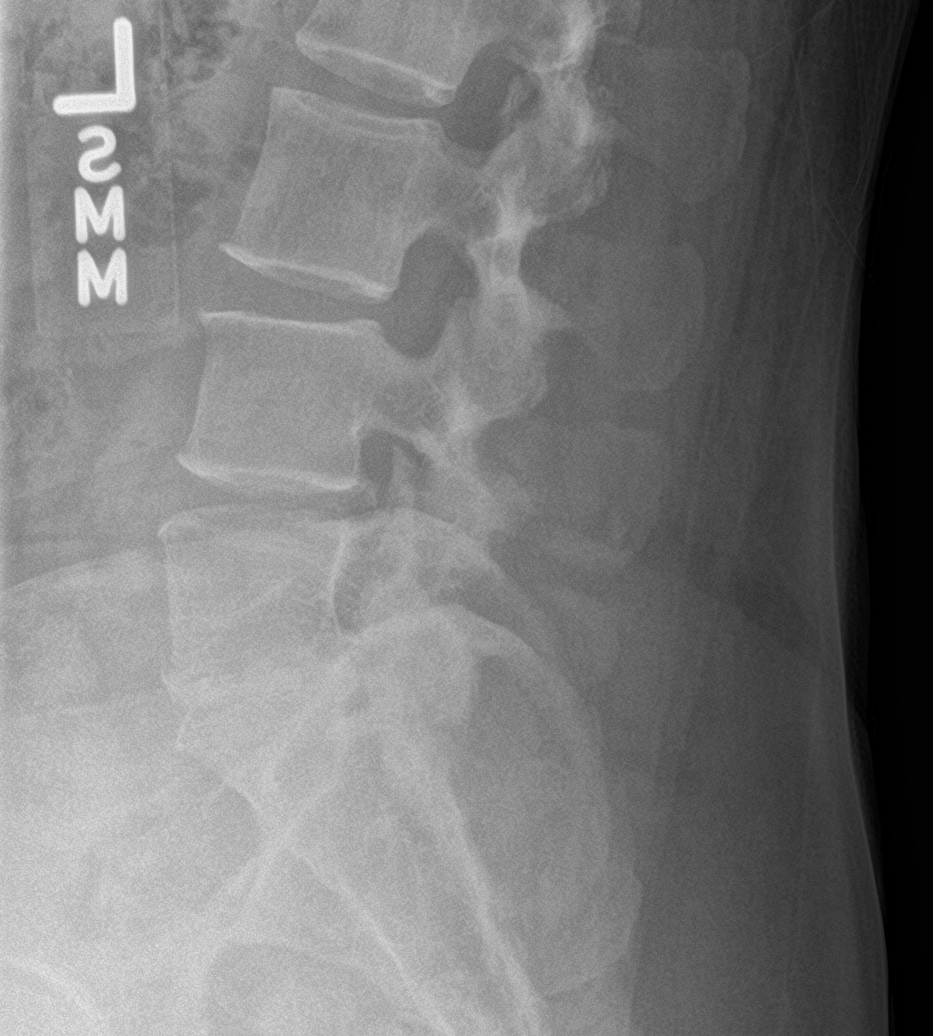

[5 of 5 positions shown; findings below may reference images not displayed]

FINDINGS: Hypoplastic last rib pair.

Five non-rib-bearing lumbar vertebra.

Osseous mineralization grossly normal for technique.

Disc space narrowing L4-L5 and L5-S1.

Vertebral body heights maintained without fracture, subluxation or
bone destruction.

No spondylolysis.

SI joints preserved.
IMPRESSION: Mild degenerative disc disease changes lower lumbar spine.

Otherwise negative exam.

## 2020-03-18 ENCOUNTER — Encounter (HOSPITAL_COMMUNITY): Payer: Self-pay | Admitting: *Deleted

## 2020-03-18 ENCOUNTER — Ambulatory Visit (HOSPITAL_COMMUNITY)
Admission: EM | Admit: 2020-03-18 | Discharge: 2020-03-18 | Disposition: A | Discharge status: Home / Self Care | Payer: No Typology Code available for payment source | Attending: Emergency Medicine | Admitting: Emergency Medicine

## 2020-03-18 ENCOUNTER — Other Ambulatory Visit: Payer: Self-pay

## 2020-03-18 DIAGNOSIS — R059 Cough, unspecified: Secondary | ICD-10-CM | POA: Diagnosis present

## 2020-03-18 DIAGNOSIS — G8929 Other chronic pain: Secondary | ICD-10-CM | POA: Insufficient documentation

## 2020-03-18 DIAGNOSIS — U071 COVID-19: Secondary | ICD-10-CM | POA: Insufficient documentation

## 2020-03-18 DIAGNOSIS — M545 Low back pain, unspecified: Secondary | ICD-10-CM | POA: Diagnosis not present

## 2020-03-18 DIAGNOSIS — H9209 Otalgia, unspecified ear: Secondary | ICD-10-CM | POA: Insufficient documentation

## 2020-03-18 DIAGNOSIS — B349 Viral infection, unspecified: Secondary | ICD-10-CM | POA: Insufficient documentation

## 2020-03-18 DIAGNOSIS — R519 Headache, unspecified: Secondary | ICD-10-CM | POA: Diagnosis not present

## 2020-03-18 DIAGNOSIS — R42 Dizziness and giddiness: Secondary | ICD-10-CM | POA: Diagnosis not present

## 2020-03-18 DIAGNOSIS — R0789 Other chest pain: Secondary | ICD-10-CM | POA: Diagnosis not present

## 2020-03-18 NOTE — ED Triage Notes (Signed)
Pt reports CP has been intermiten with cough. Pt also reports Ha ,bil ear ache and feeling dizzy.

## 2020-03-18 NOTE — ED Triage Notes (Signed)
No CP at triage

## 2020-03-18 NOTE — ED Provider Notes (Signed)
[facility]    CSN: [id number] Arrival date & time: 03/18/20  1759      History   Chief Complaint Chief Complaint  Patient presents with  . Chest Pain  . Headache  . Otalgia  . Cough  . Dizziness    HPI Anita Wheeler is a 50 y.o. female.   Patient presents with body aches, headache, dizziness, ear pain, cough, chest discomfort when coughing today.  No chest pain at this time.  She denies fever, rash, shortness of breath, abdominal pain, vomiting, diarrhea, or other symptoms.  OTC treatment attempted at home.  Her medical history includes diverticulitis, chronic low back pain, and depression.  The history is provided by the patient and medical records.    History reviewed. No pertinent past medical history.  Patient Active Problem List   Diagnosis Date Noted  . Weight gain 11/01/2011  . Routine gynecological examination 11/01/2011  . DEPRESSIVE DISORDER, NOS 05/02/2006  . DIVERTICULITIS OF COLON, NOS 05/02/2006    Past Surgical History:  Procedure Laterality Date  . bowel resesection     diverticulitis  . CHOLECYSTECTOMY    . COLON SURGERY  2003    OB History    Gravida  3   Para  3   Term  3   Preterm      AB      Living  3     SAB      IAB      Ectopic      Multiple      Live Births  3            Home Medications    Prior to Admission medications   Medication Sig Start Date End Date Taking? Authorizing Provider  ibuprofen (ADVIL,MOTRIN) 200 MG tablet Take 400 mg by mouth every 6 (six) hours as needed for moderate pain.   Yes [provider]  methocarbamol (ROBAXIN) 750 MG tablet Take 1-2 tablets (750-1,500 mg total) by mouth 3 (three) times daily as needed for muscle spasms. 11/11/17   Cristina Gong, PA-C    Family History Family History  Problem Relation Age of Onset  . Diabetes Mother   . Heart disease Brother     Social History Social History   Tobacco Use  . Smoking status: Never Smoker  .  Smokeless tobacco: Never Used  Substance Use Topics  . Alcohol use: No  . Drug use: No     Allergies   Patient has no known allergies.   Review of Systems Review of Systems  Constitutional: Negative for chills and fever.  HENT: Positive for ear pain. Negative for sore throat.   Eyes: Negative for pain and visual disturbance.  Respiratory: Positive for cough. Negative for shortness of breath.   Cardiovascular: Positive for chest pain. Negative for palpitations.  Gastrointestinal: Negative for abdominal pain, diarrhea and vomiting.  Genitourinary: Negative for dysuria and hematuria.  Musculoskeletal: Negative for arthralgias and back pain.  Skin: Negative for color change and rash.  Neurological: Positive for dizziness and headaches. Negative for seizures and syncope.  All other systems reviewed and are negative.    Physical Exam Triage Vital Signs ED Triage Vitals  Enc Vitals Group     BP      Pulse      Resp      Temp      Temp src      SpO2      Weight  Height      Head Circumference      Peak Flow      Pain Score      Pain Loc      Pain Edu?      Excl. in GC?    No data found.  Updated Vital Signs BP 113/69 (BP Location: Right Arm)   Pulse 83   Temp 98.6 F (37 C) (Oral)   Resp 18   LMP 09/03/2019   SpO2 100%   Visual Acuity Right Eye Distance:   Left Eye Distance:   Bilateral Distance:    Right Eye Near:   Left Eye Near:    Bilateral Near:     Physical Exam Vitals and nursing note reviewed.  Constitutional:      General: She is not in acute distress.    Appearance: She is well-developed and well-nourished. She is not ill-appearing.  HENT:     Head: Normocephalic and atraumatic.     Right Ear: Tympanic membrane normal.     Left Ear: Tympanic membrane normal.     Nose: Nose normal.     Mouth/Throat:     Mouth: Mucous membranes are moist.     Pharynx: Oropharynx is clear.  Eyes:     Conjunctiva/sclera: Conjunctivae normal.   Cardiovascular:     Rate and Rhythm: Normal rate and regular rhythm.     Heart sounds: Normal heart sounds.  Pulmonary:     Effort: Pulmonary effort is normal. No respiratory distress.     Breath sounds: Normal breath sounds.  Abdominal:     Palpations: Abdomen is soft.     Tenderness: There is no abdominal tenderness. There is no guarding or rebound.  Musculoskeletal:        General: No edema.     Cervical back: Neck supple.  Skin:    General: Skin is warm and dry.  Neurological:     General: No focal deficit present.     Mental Status: She is alert and oriented to person, place, and time.  Psychiatric:        Mood and Affect: Mood and affect and mood normal.        Behavior: Behavior normal.      UC Treatments / Results  Labs (all labs ordered are listed, but only abnormal results are displayed) Labs Reviewed  SARS CORONAVIRUS 2 (TAT 6-24 HRS)    EKG   Radiology No results found.  Procedures Procedures (including critical care time)  Medications Ordered in UC Medications - No data to display  Initial Impression / Assessment and Plan / UC Course  I have reviewed the triage vital signs and the nursing notes.  Pertinent labs & imaging results that were available during my care of the patient were reviewed by me and considered in my medical decision making (see chart for details).   Viral illness.  COVID pending.  Instructed patient to self quarantine until the test results are back.  Discussed symptomatic treatment including Tylenol, rest, hydration.  Instructed patient to follow up with PCP if her symptoms are not improving.  Patient agrees to plan of care.    Final Clinical Impressions(s) / UC Diagnoses   Final diagnoses:  Viral illness     Discharge Instructions     Your COVID test is pending.  You should self quarantine until the test result is back.    Take Tylenol or ibuprofen as needed for fever or discomfort.  Rest and keep yourself hydrated.  Follow-up with your primary care provider if your symptoms are not improving.        ED Prescriptions    None     PDMP not reviewed this encounter.   Mickie Bail, NP 03/18/20 1944

## 2020-03-18 NOTE — Discharge Instructions (Addendum)
Your COVID test is pending.  You should self quarantine until the test result is back.    Take Tylenol or ibuprofen as needed for fever or discomfort.  Rest and keep yourself hydrated.    Follow-up with your primary care provider if your symptoms are not improving.     

## 2020-03-19 LAB — SARS CORONAVIRUS 2 (TAT 6-24 HRS): SARS Coronavirus 2: POSITIVE — AB
